# Patient Record
Sex: Female | Born: 2000 | Race: Black or African American | Hispanic: No | Marital: Single | Smoking: Never smoker
Health system: Southern US, Community
[De-identification: ages and names within clinical notes are randomized; demographics above are authoritative.]

## PROBLEM LIST (undated history)

## (undated) ENCOUNTER — Ambulatory Visit: Admission: EM

## (undated) DIAGNOSIS — N921 Excessive and frequent menstruation with irregular cycle: Secondary | ICD-10-CM

## (undated) DIAGNOSIS — K5090 Crohn's disease, unspecified, without complications: Secondary | ICD-10-CM

## (undated) DIAGNOSIS — E559 Vitamin D deficiency, unspecified: Secondary | ICD-10-CM

## (undated) DIAGNOSIS — R802 Orthostatic proteinuria, unspecified: Secondary | ICD-10-CM

## (undated) HISTORY — DX: Vitamin D deficiency, unspecified: E55.9

## (undated) HISTORY — DX: Excessive and frequent menstruation with irregular cycle: N92.1

## (undated) HISTORY — DX: Orthostatic proteinuria, unspecified: R80.2

## (undated) HISTORY — PX: NO PAST SURGERIES: SHX2092

---
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
## 2018-01-10 ENCOUNTER — Other Ambulatory Visit: Payer: Self-pay

## 2018-01-10 ENCOUNTER — Emergency Department: Payer: 59

## 2018-01-10 ENCOUNTER — Encounter: Payer: Self-pay | Admitting: Emergency Medicine

## 2018-01-10 ENCOUNTER — Emergency Department
Admission: EM | Admit: 2018-01-10 | Discharge: 2018-01-10 | Disposition: A | Discharge status: Home / Self Care | Payer: 59 | Attending: Emergency Medicine | Admitting: Emergency Medicine

## 2018-01-10 DIAGNOSIS — N938 Other specified abnormal uterine and vaginal bleeding: Secondary | ICD-10-CM | POA: Insufficient documentation

## 2018-01-10 DIAGNOSIS — N939 Abnormal uterine and vaginal bleeding, unspecified: Secondary | ICD-10-CM | POA: Diagnosis not present

## 2018-01-10 DIAGNOSIS — R102 Pelvic and perineal pain: Secondary | ICD-10-CM

## 2018-01-10 DIAGNOSIS — R140 Abdominal distension (gaseous): Secondary | ICD-10-CM | POA: Diagnosis not present

## 2018-01-10 LAB — URINALYSIS, COMPLETE (UACMP) WITH MICROSCOPIC
BILIRUBIN URINE: NEGATIVE
Glucose, UA: NEGATIVE mg/dL
KETONES UR: 20 mg/dL — AB
LEUKOCYTES UA: NEGATIVE
NITRITE: NEGATIVE
PROTEIN: 100 mg/dL — AB
Specific Gravity, Urine: 1.035 — ABNORMAL HIGH (ref 1.005–1.030)
pH: 5 (ref 5.0–8.0)

## 2018-01-10 LAB — CBC WITH DIFFERENTIAL/PLATELET
BASOS ABS: 0 10*3/uL (ref 0–0.1)
BASOS PCT: 1 %
EOS ABS: 0.1 10*3/uL (ref 0–0.7)
Eosinophils Relative: 2 %
HEMATOCRIT: 37.8 % (ref 35.0–47.0)
HEMOGLOBIN: 13.4 g/dL (ref 12.0–16.0)
Lymphocytes Relative: 26 %
Lymphs Abs: 1.7 10*3/uL (ref 1.0–3.6)
MCH: 29.2 pg (ref 26.0–34.0)
MCHC: 35.5 g/dL (ref 32.0–36.0)
MCV: 82.4 fL (ref 80.0–100.0)
MONOS PCT: 11 %
Monocytes Absolute: 0.7 10*3/uL (ref 0.2–0.9)
NEUTROS PCT: 60 %
Neutro Abs: 4 10*3/uL (ref 1.4–6.5)
PLATELETS: 271 10*3/uL (ref 150–440)
RBC: 4.59 MIL/uL (ref 3.80–5.20)
RDW: 13.1 % (ref 11.5–14.5)
WBC: 6.5 10*3/uL (ref 3.6–11.0)

## 2018-01-10 LAB — CHLAMYDIA/NGC RT PCR (ARMC ONLY)
Chlamydia Tr: NOT DETECTED
N gonorrhoeae: NOT DETECTED

## 2018-01-10 LAB — BASIC METABOLIC PANEL
Anion gap: 9 (ref 5–15)
BUN: 25 mg/dL — ABNORMAL HIGH (ref 6–20)
CALCIUM: 9.1 mg/dL (ref 8.9–10.3)
CO2: 19 mmol/L — ABNORMAL LOW (ref 22–32)
Chloride: 107 mmol/L (ref 101–111)
Creatinine, Ser: 0.58 mg/dL (ref 0.44–1.00)
Glucose, Bld: 83 mg/dL (ref 65–99)
POTASSIUM: 3.6 mmol/L (ref 3.5–5.1)
Sodium: 135 mmol/L (ref 135–145)

## 2018-01-10 LAB — HCG, QUANTITATIVE, PREGNANCY: hCG, Beta Chain, Quant, S: <1 m[IU]/mL (ref <5)

## 2018-01-10 LAB — TYPE AND SCREEN
ABO/RH(D): O POS
ANTIBODY SCREEN: NEGATIVE

## 2018-01-10 LAB — WET PREP, GENITAL
CLUE CELLS WET PREP: NONE SEEN
Sperm: NONE SEEN
TRICH WET PREP: NONE SEEN
Yeast Wet Prep HPF POC: NONE SEEN

## 2018-01-10 MED ORDER — TRANEXAMIC ACID 650 MG PO TABS: 1300.0000 mg | ORAL_TABLET | Freq: Three times a day (TID) | ORAL | 0 refills | Status: DC

## 2018-01-10 NOTE — ED Triage Notes (Signed)
Pt arrives ambulatory to triage with c/o heavy vaginal bleeding "for like 20 days". Pt reports that she has been changing her tampon every 45 min for the last two days. Pt is in NAD at this time in triage.

## 2018-01-10 NOTE — ED Notes (Signed)
Pt states unable to void at this time for POC.

## 2018-01-10 NOTE — ED Provider Notes (Addendum)
[facility] Emergency Department Provider Note  ____________________________________________   First MD Initiated Contact with Patient 01/10/18 2056     (approximate)  I have reviewed the triage vital signs and the nursing notes.   HISTORY  Chief Complaint Vaginal Bleeding   HPI Amber Gibson is a 17 y.o. female with a history of menometrorrhagia on Depakote, last shot 3 months ago who is presenting to the emergency department with leading over the past 20 days.  Says the bleeding has been recently more heavy and she feels that she is soaking 4-5 tampons per day at this time.  Said that she felt slightly lightheaded today which is why she came to the emergency department for further evaluation.  Saying that she also has noticed abdominal distention over the past several months which is made her concerned.  Past Medical History:  Diagnosis Date  . Menometrorrhagia   . Orthostatic proteinuria   . Vitamin D deficiency     Patient Active Problem List   Diagnosis Date Noted  . Menometrorrhagia 07/14/2015    Past Surgical History:  Procedure Laterality Date  . NO PAST SURGERIES      Prior to Admission medications   Medication Sig Start Date End Date Taking? Authorizing Provider  medroxyPROGESTERone (DEPO-PROVERA) 150 MG/ML injection Inject 1 mL (150 mg total) into the muscle every 3 (three) months. 05/09/17   Shambley, Melody N, CNM  naproxen (NAPROSYN) 500 MG tablet Take 1 tablet (500 mg total) 2 (two) times daily with a meal by mouth. As needed for pain Patient not taking: Reported on 10/11/2017 07/08/17   Shambley, Melody N, CNM  ondansetron (ZOFRAN ODT) 4 MG disintegrating tablet Take 1 tablet (4 mg total) by mouth every 8 (eight) hours as needed for nausea or vomiting. Patient not taking: Reported on 05/09/2017 03/30/17   Rebecka Apley, MD    Allergies Patient has no known allergies.  Family History  Problem Relation Age of Onset  .  Lupus Maternal Grandmother   . Diabetes Maternal Grandmother   . Heart disease Maternal Grandmother   . Hypertension Maternal Grandmother   . COPD Maternal Grandmother   . Diabetes Maternal Grandfather   . Heart disease Maternal Grandfather   . Hypertension Maternal Grandfather   . Cancer Neg Hx   . Stroke Neg Hx     Social History Social History   Tobacco Use  . Smoking status: Never Smoker  . Smokeless tobacco: Never Used  Substance Use Topics  . Alcohol use: No    Alcohol/week: 0.0 oz  . Drug use: No    Review of Systems  Constitutional: No fever/chills Eyes: No visual changes. ENT: No sore throat. Cardiovascular: Denies chest pain. Respiratory: Denies shortness of breath. Gastrointestinal: No nausea, no vomiting.  No diarrhea.  No constipation. Genitourinary: As above Musculoskeletal: Negative for back pain. Skin: Negative for rash. Neurological: Negative for headaches, focal weakness or numbness.   ____________________________________________   PHYSICAL EXAM:  VITAL SIGNS: ED Triage Vitals  Enc Vitals Group     BP 01/10/18 1921 124/79     Pulse Rate 01/10/18 1921 85     Resp 01/10/18 1921 18     Temp 01/10/18 1921 98.2 F (36.8 C)     Temp Source 01/10/18 1921 Oral     SpO2 01/10/18 1921 96 %     Weight 01/10/18 1918 137 lb (62.1 kg)     Height 01/10/18 1918 5\' 3"  (1.6 m)  Head Circumference --      Peak Flow --      Pain Score 01/10/18 1918 6     Pain Loc --      Pain Edu? --      Excl. in GC? --     Constitutional: Alert and oriented. Well appearing and in no acute distress. Eyes: Conjunctivae are normal.  Head: Atraumatic. Nose: No congestion/rhinnorhea. Mouth/Throat: Mucous membranes are moist.  Neck: No stridor.   Cardiovascular: Normal rate, regular rhythm. Grossly normal heart sounds.  Good peripheral circulation. Respiratory: Normal respiratory effort.  No retractions. Lungs CTAB. Gastrointestinal: Soft and nontender. No  distention. No CVA tenderness. Genitourinary: Normal external exam without any lesions.  Speculum exam with a small amount of blood surrounding the cervix.  However, there is no active bleeding from the cervical loss.  No discharge.  Bimanual exam without CMT.  No uterine nor adnexal tenderness nor masses. Musculoskeletal: No lower extremity tenderness nor edema.  No joint effusions. Neurologic:  Normal speech and language. No gross focal neurologic deficits are appreciated. Skin:  Skin is warm, dry and intact. No rash noted. Psychiatric: Mood and affect are normal. Speech and behavior are normal.  ____________________________________________   LABS (all labs ordered are listed, but only abnormal results are displayed)  Labs Reviewed  WET PREP, GENITAL - Abnormal; Notable for the following components:      Result Value   WBC, Wet Prep HPF POC FEW (*)    All other components within normal limits  BASIC METABOLIC PANEL - Abnormal; Notable for the following components:   CO2 19 (*)    BUN 25 (*)    All other components within normal limits  CHLAMYDIA/NGC RT PCR ([facility] ONLY)  HCG, QUANTITATIVE, PREGNANCY  CBC WITH DIFFERENTIAL/PLATELET  URINALYSIS, COMPLETE (UACMP) WITH MICROSCOPIC  POC URINE PREG, ED  TYPE AND SCREEN   ____________________________________________  EKG   ____________________________________________  RADIOLOGY  Normal ultrasound of the pelvis without any masses. ____________________________________________   PROCEDURES  Procedure(s) performed:   Procedures  Critical Care performed:   ____________________________________________   INITIAL IMPRESSION / ASSESSMENT AND PLAN / ED COURSE  Pertinent labs & imaging results that were available during my care of the patient were reviewed by me and considered in my medical decision making (see chart for details).  Differential diagnosis includes, but is not limited to, ovarian cyst, ovarian torsion,  acute appendicitis, diverticulitis, urinary tract infection/pyelonephritis, endometriosis, bowel obstruction, colitis, renal colic, gastroenteritis, hernia, fibroids, endometriosis, pregnancy related pain including ectopic pregnancy, etc. As part of my medical decision making, I reviewed the following data within the electronic MEDICAL RECORD NUMBER Notes from prior ED visits  ----------------------------------------- 10:43 PM on 01/10/2018 -----------------------------------------  I discussed the case with Dr. Valentino Saxon of [facility] who recommends TXA over the next 5 days, 1300, 3 times daily.  Patient has failed multiple birth control pills secondary to noncompliance as well as nausea and vomiting.  Discussions have been made with the patient regarding an IUD.  She will need to follow-up with her OB/GYN for a more long-term solution to the bleeding.  However, the TXA should hopefully relieve the symptoms over the next few days.  Patient aware of lab as well as imaging results.  Return precautions given especially for worsening bleeding, pain or lightheadedness or syncope.  Patient understanding the diagnosis as well as to the treatment plan willing to comply. ____________________________________________   FINAL CLINICAL IMPRESSION(S) / ED DIAGNOSES  Final diagnoses:  Pelvic pain  Pelvic pain  Dysfunctional uterine bleeding.    NEW MEDICATIONS STARTED DURING THIS VISIT:  New Prescriptions   No medications on file     Note:  This document was prepared using Dragon voice recognition software and may include unintentional dictation errors.     Myrna Blazer, MD 01/10/18 2244    Myrna Blazer, MD 01/10/18 [phone]

## 2018-01-13 ENCOUNTER — Ambulatory Visit: Payer: 59

## 2018-02-04 ENCOUNTER — Encounter: Payer: Self-pay | Admitting: Family Medicine

## 2018-02-04 ENCOUNTER — Ambulatory Visit (INDEPENDENT_AMBULATORY_CARE_PROVIDER_SITE_OTHER): Payer: 59 | Admitting: Family Medicine

## 2018-02-04 VITALS — BP 122/68 | HR 95 | Temp 98.6°F | Resp 12 | Ht 63.5 in | Wt 137.5 lb

## 2018-02-04 DIAGNOSIS — R8290 Unspecified abnormal findings in urine: Secondary | ICD-10-CM | POA: Diagnosis not present

## 2018-02-04 DIAGNOSIS — N921 Excessive and frequent menstruation with irregular cycle: Secondary | ICD-10-CM | POA: Diagnosis not present

## 2018-02-04 DIAGNOSIS — E559 Vitamin D deficiency, unspecified: Secondary | ICD-10-CM

## 2018-02-04 NOTE — Patient Instructions (Addendum)
Let's start with labs We'll have you see the new provider at [facility] If you have not heard anything from my staff in a week about any orders/referrals/studies from today, please contact us here to follow-up [phone]   Safe Sex Practicing safe sex means taking steps before and during sex to reduce your risk of:  Getting an STD (sexually transmitted disease).  Giving your partner an STD.  Unwanted pregnancy.  How can I practice safe sex?  To practice safe sex:  Limit your sexual partners to only one partner who is having sex with only you.  Avoid using alcohol and recreational drugs before having sex. These substances can affect your judgment.  Before having sex with a new partner: ? Talk to your partner about past partners, past STDs, and drug use. ? You and your partner should be screened for STDs and discuss the results with each other.  Check your body regularly for sores, blisters, rashes, or unusual discharge. If you notice any of these problems, visit your health care provider.  If you have symptoms of an infection or you are being treated for an STD, avoid sexual contact.  While having sex, use a condom. Make sure to: ? Use a condom every time you have vaginal, oral, or anal sex. Both females and males should wear condoms during oral sex. ? Keep condoms in place from the beginning to the end of sexual activity. ? Use a latex condom, if possible. Latex condoms offer the best protection. ? Use only water-based lubricants or oils to lubricate a condom. Using petroleum-based lubricants or oils will weaken the condom and increase the chance that it will break.  See your health care provider for regular screenings, exams, and tests for STDs.  Talk with your health care provider about the form of birth control (contraception) that is best for you.  Get vaccinated against hepatitis B and human papillomavirus (HPV).  If you are at risk of being infected with HIV  (human immunodeficiency virus), talk with your health care provider about taking a prescription medicine to prevent HIV infection. You are considered at risk for HIV if: ? You are a man who has sex with other men. ? You are a heterosexual man or woman who is sexually active with more than one partner. ? You take drugs by injection. ? You are sexually active with a partner who has HIV.  This information is not intended to replace advice given to you by your health care provider. Make sure you discuss any questions you have with your health care provider. Document Released: 09/20/2004 Document Revised: 12/28/2015 Document Reviewed: 07/03/2015 Elsevier Interactive Patient Education  [employer].

## 2018-02-04 NOTE — Progress Notes (Signed)
BP 122/68   Pulse 95   Temp 98.6 F (37 C) (Oral)   Resp 12   Ht 5' 3.5" (1.613 m)   Wt 137 lb 8 oz (62.4 kg)   LMP 12/02/2017   SpO2 97%   BMI 23.97 kg/m    Subjective:    Patient ID: Amber Gibson, female    DOB: 05-26-2001, 17 y.o.   MRN: [id number]  HPI: Amber Gibson is a 17 y.o. female  Chief Complaint  Patient presents with  . New Patient (Initial Visit)  . Menstrual Problem    started on April 8 and has not stopped bleeding since    HPI Patient is here to re-establish care at this clinic; she was seen by me at [facility] on 07/14/2015 for a well adolescent visit  She has had ongoing menstrual issues, with numerous visits in the medical record with gynecologist; most recently, she started bleeding on April 8th and says that she has not stopped bleeding since then; she has a gynecologist; was in the ER on 01/10/18; note reviewed  Flow is heavier at work; on her feet; has cramping; lighter when home; bleeding every day since April 8th; using naproxen for cramps when needed; does not need refills  Not getting iron-rich foods; energy level is okay some days  Labs reviewed; abnormal urine in ER in May; GC and chl negative; no trich, no clue cells; not anemic (normal CBC); quant hCG <1  Saw gyn and the ER doctor also consulted with GYN during her recent ER visit; patient was supposed to f/u with GYN for this issue; patient says the lysteda did not help; the next option she was given was for an IUD but not interested she'd like to go to another GYN practice  I don't see a thyroid checked; maternal aunt has thyroid trouble; patient has noticed weight gain, fifteen pounds over the last 6 months; having constipation; hair has been shedding and falling out faster  Typical [place] diet  She picks up heavy stuff at work; has a work restriction from the ER; picks up over 30 pounds before the work restriction; they wrote her for no more than 20  pounds; that has not been working well for her she says; I searched the ER record but do not see anything about a work restriction, no letter in the chart; she said her boss hasn't been following it anyway  Normal pelvic US on 01/10/18; noted in chart, reviewed with patient  Sexually active; no condoms, just withdrawal  Depression screen [facility] 2/9 02/04/2018  Decreased Interest 0  Down, Depressed, Hopeless 0  PHQ - 2 Score 0    Relevant past medical, surgical, family and social history reviewed Past Medical History:  Diagnosis Date  . Menometrorrhagia   . Orthostatic proteinuria   . Vitamin D deficiency    Past Surgical History:  Procedure Laterality Date  . NO PAST SURGERIES     Family History  Problem Relation Age of Onset  . Lupus Maternal Grandmother   . Diabetes Maternal Grandmother   . Heart disease Maternal Grandmother   . Hypertension Maternal Grandmother   . COPD Maternal Grandmother   . Diabetes Maternal Grandfather   . Heart disease Maternal Grandfather   . Hypertension Maternal Grandfather   . Cancer Neg Hx   . Stroke Neg Hx    Social History   Tobacco Use  . Smoking status: Never Smoker  . Smokeless tobacco: Never Used  Substance  Use Topics  . Alcohol use: No    Alcohol/week: 0.0 oz  . Drug use: No    Interim medical history since last visit reviewed. Allergies and medications reviewed  Review of Systems Per HPI unless specifically indicated above     Objective:    BP 122/68   Pulse 95   Temp 98.6 F (37 C) (Oral)   Resp 12   Ht 5' 3.5" (1.613 m)   Wt 137 lb 8 oz (62.4 kg)   LMP 12/02/2017   SpO2 97%   BMI 23.97 kg/m   Wt Readings from Last 3 Encounters:  02/04/18 137 lb 8 oz (62.4 kg) (69 %, Z= 0.49)*  01/10/18 137 lb (62.1 kg) (68 %, Z= 0.48)*  10/11/17 132 lb 11.2 oz (60.2 kg) (63 %, Z= 0.33)*   * Growth percentiles are based on CDC (Girls, 2-20 Years) data.    Physical Exam  Constitutional: She appears well-developed and  well-nourished.  HENT:  Mouth/Throat: Mucous membranes are normal.  Eyes: EOM are normal. No scleral icterus.  Neck: No thyromegaly present.  Cardiovascular: Normal rate and regular rhythm.  Pulmonary/Chest: Effort normal and breath sounds normal.  Abdominal: Soft. Bowel sounds are normal. She exhibits distension (full, but soft). There is no tenderness. There is no guarding.  Musculoskeletal: She exhibits no edema.  Neurological:  Reflex Scores:      Patellar reflexes are 2+ on the right side and 2+ on the left side. Psychiatric: She has a normal mood and affect. Her behavior is normal.    Results for orders placed or performed during the hospital encounter of 01/10/18  Wet prep, genital  Result Value Ref Range   Yeast Wet Prep HPF POC NONE SEEN NONE SEEN   Trich, Wet Prep NONE SEEN NONE SEEN   Clue Cells Wet Prep HPF POC NONE SEEN NONE SEEN   WBC, Wet Prep HPF POC FEW (A) NONE SEEN   Sperm NONE SEEN   Chlamydia/NGC rt PCR ([facility] only)  Result Value Ref Range   Specimen source GC/Chlam CHLAMYDIA SPECIES    Chlamydia Tr NOT DETECTED NOT DETECTED   N gonorrhoeae NOT DETECTED NOT DETECTED  hCG, quantitative, pregnancy  Result Value Ref Range   hCG, Beta Chain, Quant, S <1 <5 mIU/mL  CBC with Differential  Result Value Ref Range   WBC 6.5 3.6 - 11.0 K/uL   RBC 4.59 3.80 - 5.20 MIL/uL   Hemoglobin 13.4 12.0 - 16.0 g/dL   HCT 26.7 12.4 - 58.0 %   MCV 82.4 80.0 - 100.0 fL   MCH 29.2 26.0 - 34.0 pg   MCHC 35.5 32.0 - 36.0 g/dL   RDW 99.8 33.8 - 25.0 %   Platelets 271 150 - 440 K/uL   Neutrophils Relative % 60 %   Neutro Abs 4.0 1.4 - 6.5 K/uL   Lymphocytes Relative 26 %   Lymphs Abs 1.7 1.0 - 3.6 K/uL   Monocytes Relative 11 %   Monocytes Absolute 0.7 0.2 - 0.9 K/uL   Eosinophils Relative 2 %   Eosinophils Absolute 0.1 0 - 0.7 K/uL   Basophils Relative 1 %   Basophils Absolute 0.0 0 - 0.1 K/uL  Basic metabolic panel  Result Value Ref Range   Sodium 135 135 - 145 mmol/L     Potassium 3.6 3.5 - 5.1 mmol/L   Chloride 107 101 - 111 mmol/L   CO2 19 (L) 22 - 32 mmol/L   Glucose, Bld 83 65 - 99  mg/dL   BUN 25 (H) 6 - 20 mg/dL   Creatinine, Ser 1.61 0.44 - 1.00 mg/dL   Calcium 9.1 8.9 - 09.6 mg/dL   GFR calc non Af Amer >60 >60 mL/min   GFR calc Af Amer >60 >60 mL/min   Anion gap 9 5 - 15  Urinalysis, Complete w Microscopic  Result Value Ref Range   Color, Urine YELLOW (A) YELLOW   APPearance HAZY (A) CLEAR   Specific Gravity, Urine 1.035 (H) 1.005 - 1.030   pH 5.0 5.0 - 8.0   Glucose, UA NEGATIVE NEGATIVE mg/dL   Hgb urine dipstick LARGE (A) NEGATIVE   Bilirubin Urine NEGATIVE NEGATIVE   Ketones, ur 20 (A) NEGATIVE mg/dL   Protein, ur 045 (A) NEGATIVE mg/dL   Nitrite NEGATIVE NEGATIVE   Leukocytes, UA NEGATIVE NEGATIVE   RBC / HPF >50 (H) 0 - 5 RBC/hpf   WBC, UA 0-5 0 - 5 WBC/hpf   Bacteria, UA RARE (A) NONE SEEN   Squamous Epithelial / LPF 0-5 0 - 5   Mucus PRESENT   Type and screen [facility]  Result Value Ref Range   ABO/RH(D) O POS    Antibody Screen NEG    Sample Expiration      01/13/2018 Performed at [facility], [address] [place], [place] [address]       Assessment & Plan:   Problem List Items Addressed This Visit      Other   Menometrorrhagia - Primary    Refer back to GYN      Relevant Orders   TSH + free T4   Pregnancy, urine   CBC with Differential/Platelet   COMPLETE METABOLIC PANEL WITH GFR   Ferritin   Ambulatory referral to Obstetrics / Gynecology    Other Visit Diagnoses    Abnormal urine findings       recheck urine today   Relevant Orders   Urinalysis w microscopic + reflex cultur   Vitamin D deficiency       Relevant Orders   VITAMIN D 25 Hydroxy (Vit-D Deficiency, Fractures)       Follow up plan: No follow-ups on file.  An after-visit summary was printed and given to the patient at check-out.  Please see the patient instructions which may contain other  information and recommendations beyond what is mentioned above in the assessment and plan.  No orders of the defined types were placed in this encounter.   Orders Placed This Encounter  Procedures  . TSH + free T4  . Pregnancy, urine  . CBC with Differential/Platelet  . COMPLETE METABOLIC PANEL WITH GFR  . VITAMIN D 25 Hydroxy (Vit-D Deficiency, Fractures)  . Ferritin  . Urinalysis w microscopic + reflex cultur  . Ambulatory referral to Obstetrics / Gynecology

## 2018-02-04 NOTE — Assessment & Plan Note (Signed)
Refer back to GYN.

## 2018-02-05 ENCOUNTER — Other Ambulatory Visit: Payer: Self-pay | Admitting: Family Medicine

## 2018-02-05 ENCOUNTER — Telehealth: Payer: Self-pay

## 2018-02-05 DIAGNOSIS — R319 Hematuria, unspecified: Secondary | ICD-10-CM

## 2018-02-05 LAB — CBC WITH DIFFERENTIAL/PLATELET
BASOS PCT: 0.7 %
Basophils Absolute: 39 cells/uL (ref 0–200)
EOS ABS: 160 {cells}/uL (ref 15–500)
Eosinophils Relative: 2.9 %
HEMATOCRIT: 39.3 % (ref 34.0–46.0)
Hemoglobin: 13.3 g/dL (ref 11.5–15.3)
LYMPHS ABS: 1436 {cells}/uL (ref 1200–5200)
MCH: 27.5 pg (ref 25.0–35.0)
MCHC: 33.8 g/dL (ref 31.0–36.0)
MCV: 81.2 fL (ref 78.0–98.0)
MPV: 9.9 fL (ref 7.5–12.5)
Monocytes Relative: 10.4 %
NEUTROS PCT: 59.9 %
Neutro Abs: 3295 cells/uL (ref 1800–8000)
Platelets: 281 10*3/uL (ref 140–400)
RBC: 4.84 10*6/uL (ref 3.80–5.10)
RDW: 12.6 % (ref 11.0–15.0)
TOTAL LYMPHOCYTE: 26.1 %
WBC: 5.5 10*3/uL (ref 4.5–13.0)
WBCMIX: 572 {cells}/uL (ref 200–900)

## 2018-02-05 LAB — COMPLETE METABOLIC PANEL WITH GFR
AG RATIO: 1.4 (calc) (ref 1.0–2.5)
ALT: 18 U/L (ref 5–32)
AST: 17 U/L (ref 12–32)
Albumin: 4.6 g/dL (ref 3.6–5.1)
Alkaline phosphatase (APISO): 55 U/L (ref 47–176)
BILIRUBIN TOTAL: 0.4 mg/dL (ref 0.2–1.1)
BUN / CREAT RATIO: 40 (calc) — AB (ref 6–22)
BUN: 24 mg/dL — ABNORMAL HIGH (ref 7–20)
CALCIUM: 9.8 mg/dL (ref 8.9–10.4)
CHLORIDE: 107 mmol/L (ref 98–110)
CO2: 23 mmol/L (ref 20–32)
Creat: 0.6 mg/dL (ref 0.50–1.00)
GFR, EST AFRICAN AMERICAN: 154 mL/min/{1.73_m2} (ref >=60)
GFR, EST NON AFRICAN AMERICAN: 133 mL/min/{1.73_m2} (ref >=60)
GLOBULIN: 3.2 g/dL (ref 2.0–3.8)
Glucose, Bld: 86 mg/dL (ref 65–99)
POTASSIUM: 4.2 mmol/L (ref 3.8–5.1)
SODIUM: 138 mmol/L (ref 135–146)
TOTAL PROTEIN: 7.8 g/dL (ref 6.3–8.2)

## 2018-02-05 LAB — URINALYSIS W MICROSCOPIC + REFLEX CULTURE
BILIRUBIN URINE: NEGATIVE
Glucose, UA: NEGATIVE
Hyaline Cast: NONE SEEN /LPF
KETONES UR: NEGATIVE
LEUKOCYTE ESTERASE: NEGATIVE
Nitrites, Initial: NEGATIVE
PROTEIN: NEGATIVE
Specific Gravity, Urine: 1.021 (ref 1.001–1.03)
pH: 5.5 (ref 5.0–8.0)

## 2018-02-05 LAB — TSH+FREE T4: TSH W/REFLEX TO FT4: 1.61 mIU/L

## 2018-02-05 LAB — NO CULTURE INDICATED

## 2018-02-05 LAB — FERRITIN: Ferritin: 64 ng/mL (ref 6–67)

## 2018-02-05 LAB — VITAMIN D 25 HYDROXY (VIT D DEFICIENCY, FRACTURES): VIT D 25 HYDROXY: 15 ng/mL — AB (ref 30–100)

## 2018-02-05 LAB — PREGNANCY, URINE: Preg Test, Ur: NEGATIVE

## 2018-02-05 MED ORDER — VITAMIN D (ERGOCALCIFEROL) 1.25 MG (50000 UNIT) PO CAPS: 50000.0000 [IU] | ORAL_CAPSULE | ORAL | 1 refills | Status: AC

## 2018-02-05 NOTE — Progress Notes (Signed)
Start Rx vitamin D

## 2018-02-05 NOTE — Telephone Encounter (Signed)
-----   Message from Kerman Passey, MD sent at 02/05/2018  1:21 PM EDT ----- Asher Muir, please let pt know that her urine pregnancy test was negative; her vitamin D is really low, so start Rx vitamin D weekly for 8 weeks, then take 1,000 iu daily OTC Her total body iron stores are fine Her urine was normal except for blood in it which is likely from the menstrual bleeding; however, there has been blood persistently in her urine; I recommend a cath specimen (see if hospital lab can do or if we need to refer to urologist to get a cath urine, evaluate the blood in urine)

## 2018-02-11 ENCOUNTER — Ambulatory Visit (INDEPENDENT_AMBULATORY_CARE_PROVIDER_SITE_OTHER): Payer: 59 | Admitting: Obstetrics and Gynecology

## 2018-02-11 ENCOUNTER — Encounter: Payer: Self-pay | Admitting: Obstetrics and Gynecology

## 2018-02-11 VITALS — BP 120/80 | Ht 63.0 in | Wt 139.0 lb

## 2018-02-11 DIAGNOSIS — N938 Other specified abnormal uterine and vaginal bleeding: Secondary | ICD-10-CM | POA: Diagnosis not present

## 2018-02-11 DIAGNOSIS — N946 Dysmenorrhea, unspecified: Secondary | ICD-10-CM

## 2018-02-11 DIAGNOSIS — N921 Excessive and frequent menstruation with irregular cycle: Secondary | ICD-10-CM | POA: Diagnosis not present

## 2018-02-11 NOTE — Progress Notes (Signed)
Amber Passey, MD   Chief Complaint  Patient presents with  . Menstrual Problem    HPI:      Ms. Amber Gibson is a 17 y.o. [id number] who LMP was Patient's last menstrual period was 12/02/2017., presents today for NP eval of DUB and dysmen, referred by PCP (Lada, Janit Bern, MD. Pt with a long menstrual hx of menometrorrhagia. Menarche age 43. Menses were Q3 wks, lasting 10 days with dysmen, not relieved with NSAIDs. At some point, pt started OCPs for cycle control. Pt tried 3 different kinds but had daily vomiting with pills and irreg bleeding. Pt missed/was late with some pills. She was then started on depo about 2 yrs ago. Pt had irreg bleeding and tried more frequent dosing of Q10 wks and then Q8 wks without bleeding relief. Pt also tried OCPs and POPs with depo without bleeding relief. Pt's last depo 1/19. Started bleeding again 4/19 and bled daily until 02/08/18. Bleeding with dime to quarter sized clots, changing pads/tampons Q2 hrs. Pt with dysmen, not relieved with anaprox. Has had to miss work due to [employer]. Pt went to ED 5/19 and was given Lysteda. Sx improved while taking it but then resumed again. Pt offered IUD but not interested at the time.  Pt had neg TSH, beta HcG and CBC with PCP 6/19. Had neg GYN u/s 5/19, EM=3.2 mm and neg gon/chlam 5/19. Pt is sex active, not using condoms.    Past Medical History:  Diagnosis Date  . Menometrorrhagia   . Orthostatic proteinuria   . Vitamin D deficiency     Past Surgical History:  Procedure Laterality Date  . NO PAST SURGERIES      Family History  Problem Relation Age of Onset  . Lupus Maternal Grandmother   . Diabetes Maternal Grandmother   . Heart disease Maternal Grandmother   . Hypertension Maternal Grandmother   . COPD Maternal Grandmother   . Diabetes Maternal Grandfather   . Heart disease Maternal Grandfather   . Hypertension Maternal Grandfather   . Cancer Neg Hx   . Stroke Neg Hx     Social History    Socioeconomic History  . Marital status: Single    Spouse name: Not on file  . Number of children: Not on file  . Years of education: Not on file  . Highest education level: Not on file  Occupational History  . Not on file  Social Needs  . Financial resource strain: Not on file  . Food insecurity:    Worry: Not on file    Inability: Not on file  . Transportation needs:    Medical: Not on file    Non-medical: Not on file  Tobacco Use  . Smoking status: Never Smoker  . Smokeless tobacco: Never Used  Substance and Sexual Activity  . Alcohol use: No    Alcohol/week: 0.0 oz  . Drug use: No  . Sexual activity: Yes    Birth control/protection: None  Lifestyle  . Physical activity:    Days per week: Not on file    Minutes per session: Not on file  . Stress: Not on file  Relationships  . Social connections:    Talks on phone: Not on file    Gets together: Not on file    Attends religious service: Not on file    Active member of club or organization: Not on file    Attends meetings of clubs or organizations: Not on file  Relationship status: Not on file  . Intimate partner violence:    Fear of current or ex partner: Not on file    Emotionally abused: Not on file    Physically abused: Not on file    Forced sexual activity: Not on file  Other Topics Concern  . Not on file  Social History Narrative  . Not on file    Outpatient Medications Prior to Visit  Medication Sig Dispense Refill  . naproxen (NAPROSYN) 500 MG tablet Take 1 tablet (500 mg total) 2 (two) times daily with a meal by mouth. As needed for pain 180 tablet 2  . Vitamin D, Ergocalciferol, (DRISDOL) 50000 units CAPS capsule Take 1 capsule (50,000 Units total) by mouth every 7 (seven) days. (Patient not taking: Reported on 02/11/2018) 4 capsule 1   No facility-administered medications prior to visit.       ROS:  Review of Systems  Constitutional: Positive for fatigue. Negative for fever and unexpected  weight change.  Respiratory: Negative for cough, shortness of breath and wheezing.   Cardiovascular: Negative for chest pain, palpitations and leg swelling.  Gastrointestinal: Negative for blood in stool, constipation, diarrhea, nausea and vomiting.  Endocrine: Negative for cold intolerance, heat intolerance and polyuria.  Genitourinary: Positive for menstrual problem. Negative for dyspareunia, dysuria, flank pain, frequency, genital sores, hematuria, pelvic pain, urgency, vaginal bleeding, vaginal discharge and vaginal pain.  Musculoskeletal: Negative for back pain, joint swelling and myalgias.  Skin: Negative for rash.  Neurological: Negative for dizziness, syncope, light-headedness, numbness and headaches.  Hematological: Negative for adenopathy.  Psychiatric/Behavioral: Negative for agitation, confusion, sleep disturbance and suicidal ideas. The patient is not nervous/anxious.    BREAST: No symptoms   OBJECTIVE:   Vitals:  BP 120/80   Ht 5\' 3"  (1.6 m)   Wt 139 lb (63 kg)   LMP 12/02/2017   BMI 24.62 kg/m   Physical Exam  Constitutional: She is oriented to person, place, and time. Vital signs are normal. She appears well-developed.  Neck: Normal range of motion.  Pulmonary/Chest: Effort normal.  Genitourinary: Vagina normal and uterus normal. There is no rash, tenderness or lesion on the right labia. There is no rash, tenderness or lesion on the left labia. Uterus is not enlarged and not tender. Cervix exhibits no motion tenderness. Right adnexum displays no mass and no tenderness. Left adnexum displays no mass and no tenderness. No erythema or tenderness in the vagina. No vaginal discharge found.  Musculoskeletal: Normal range of motion.  Neurological: She is alert and oriented to person, place, and time.  Psychiatric: She has a normal mood and affect. Her behavior is normal. Thought content normal.  Vitals reviewed.   Assessment/Plan: DUB (dysfunctional uterine bleeding) -  On several forms of BC and then after stopping depo. Bleeding has since stopped. Neg eval with PCP/ED. Neg exam today.  Menometrorrhagia - Not controlled with OCPs/depo in past.   Dysmenorrhea  Discussed normal cycles and question if all the hormonal changes of frequent depo shots, missed OCPs, etc, have contributed to sx. Discussed going off hormones for a few months to see if cycles regulate/improve vs IUD and nexplanon. Pt with vomiting with OCPs but may do fine with nuvaring. Pt elects to follow cycles. If still problematic, pt is interested in [place] IUD. CONDOMS! Handout/pro/cons discussed. Cont naproxen BID for dysmen, and can take 2-3 days before menses to help with sx.     Return if symptoms worsen or fail to improve.  Avyay Coger B. Eliceo Gladu,  PA-C 02/11/2018 12:22 PM

## 2018-02-11 NOTE — Patient Instructions (Signed)
I value your feedback and entrusting us with your care. If you get a [facility] [place] patient survey, I would appreciate you taking the time to let us know about your experience today. Thank you! 

## 2018-02-26 ENCOUNTER — Other Ambulatory Visit: Payer: Self-pay | Admitting: Family Medicine

## 2018-02-26 NOTE — Telephone Encounter (Signed)
I prescribed a two month supply on 02/05/2018 She should not need any beyond what I prescribed on June 12th After she finishes the prescription, she should take over-the-counter vitamin D  See note from labs 02/05/2018: "her vitamin D is really low, so start Rx vitamin D weekly for 8 weeks, then take 1,000 iu daily OTC"  Thank you

## 2018-03-11 NOTE — Progress Notes (Deleted)
03/12/2018 7:06 PM   Amber Gibson 2001/07/02 [id number]  Referring provider: Kerman Passey, MD [address] [place], [place] [address]  No chief complaint on file.   HPI: Patient is a 17 -year-old [place] American female who presents today as a referral from Dr. Baruch Gouty for possible hematuria.    Patient was found to have *** hematuria on *** with *** RBC's/hpf.  Patient *** does/doesn't have a prior history of microscopic hematuria.    She/He does/ does not have a prior history of recurrent urinary tract infections, nephrolithiasis, trauma to the genitourinary tract or malignancies of the genitourinary tract. ***  She/He does/does not have a family medical history of nephrolithiasis, malignancies of the genitourinary tract or hematuria. ***  Today, she/he are having/not having symptoms of frequent urination, urgency, dysuria, nocturia, incontinence, hesitancy, intermittency, straining to urinate or a weak urinary stream.  Patient denies any gross hematuria, dysuria or suprapubic/flank pain.  Patient denies any fevers, chills, nausea or vomiting. Her/His UA today demonstrates ***.  Patient denies any gross hematuria, dysuria or suprapubic/flank pain.  Patient denies any fevers, chills, nausea or vomiting.  ***  She/He have/have not had any recent imaging studies. ***  He/She are/are not a smoker. She/He is a former smoker, with a*** ppd history.  Quit *** years ago.  They are/are not exposed to secondhand smoke.  They have/have not worked with Personnel officer, trichloroethylene, etc.   ***  He/She has HTN. ***   He/She has a high BMI.     PMH: Past Medical History:  Diagnosis Date  . Menometrorrhagia   . Orthostatic proteinuria   . Vitamin D deficiency     Surgical History: Past Surgical History:  Procedure Laterality Date  . NO PAST SURGERIES      Home Medications:  Allergies as of 03/12/2018   No Known Allergies     Medication  List        Accurate as of 03/11/18  7:06 PM. Always use your most recent med list.          naproxen 500 MG tablet Commonly known as:  NAPROSYN Take 1 tablet (500 mg total) 2 (two) times daily with a meal by mouth. As needed for pain   Vitamin D (Ergocalciferol) 50000 units Caps capsule Commonly known as:  DRISDOL Take 1 capsule (50,000 Units total) by mouth every 7 (seven) days.       Allergies: No Known Allergies  Family History: Family History  Problem Relation Age of Onset  . Lupus Maternal Grandmother   . Diabetes Maternal Grandmother   . Heart disease Maternal Grandmother   . Hypertension Maternal Grandmother   . COPD Maternal Grandmother   . Diabetes Maternal Grandfather   . Heart disease Maternal Grandfather   . Hypertension Maternal Grandfather   . Cancer Neg Hx   . Stroke Neg Hx     Social History:  reports that she has never smoked. She has never used smokeless tobacco. She reports that she does not drink alcohol or use drugs.  ROS:                                        Physical Exam: There were no vitals taken for this visit.  Constitutional:  Well nourished. Alert and oriented, No acute distress. HEENT: [place] AT, moist mucus membranes.  Trachea midline, no masses. Cardiovascular:  No clubbing, cyanosis, or edema. Respiratory: Normal respiratory effort, no increased work of breathing. GI: Abdomen is soft, non tender, non distended, no abdominal masses. Liver and spleen not palpable.  No hernias appreciated.  Stool sample for occult testing is not indicated.   GU: No CVA tenderness.  No bladder fullness or masses.   Skin: No rashes, bruises or suspicious lesions. Lymph: No cervical or inguinal adenopathy. Neurologic: Grossly intact, no focal deficits, moving all 4 extremities. Psychiatric: Normal mood and affect.  Laboratory Data: Lab Results  Component Value Date   WBC 5.5 02/04/2018   HGB 13.3 02/04/2018   HCT 39.3  02/04/2018   MCV 81.2 02/04/2018   PLT 281 02/04/2018    Lab Results  Component Value Date   CREATININE 0.60 02/04/2018    No results found for: PSA  No results found for: TESTOSTERONE  No results found for: HGBA1C  Lab Results  Component Value Date   TSH 0.925 07/14/2015       Component Value Date/Time   CHOL 121 07/14/2015 1523   HDL 54 07/14/2015 1523   LDLCALC 60 07/14/2015 1523    Lab Results  Component Value Date   AST 17 02/04/2018   Lab Results  Component Value Date   ALT 18 02/04/2018   No components found for: ALKALINEPHOPHATASE No components found for: BILIRUBINTOTAL  No results found for: ESTRADIOL   Urinalysis    Component Value Date/Time   COLORURINE YELLOW 02/04/2018 1044   APPEARANCEUR CLEAR 02/04/2018 1044   LABSPEC 1.021 02/04/2018 1044   PHURINE 5.5 02/04/2018 1044   GLUCOSEU NEGATIVE 02/04/2018 1044   HGBUR 3+ (A) 02/04/2018 1044   BILIRUBINUR NEGATIVE 01/10/2018 2115   KETONESUR NEGATIVE 02/04/2018 1044   PROTEINUR NEGATIVE 02/04/2018 1044   NITRITE NEGATIVE 01/10/2018 2115   LEUKOCYTESUR NEGATIVE 01/10/2018 2115    Pertinent Imaging: ***  Assessment & Plan:  ***  1. *** hematuria Explained to the patient that there are a number of causes that can be associated with blood in the urine, such as stones, *** BPH, UTI's, damage to the urinary tract and/or cancer. At this time, I felt that the patient warranted further urologic evaluation.   The AUA guidelines state that a CT urogram is the preferred imaging study to evaluate hematuria. I explained to the patient that a contrast material will be injected into a vein and that in rare instances, an allergic reaction can result and may even life threatening   The patient denies any allergies to contrast***, iodine and/or seafood*** and is not taking metformin.*** Her reproductive status is hysterectomy, postmenopausal, tubal ligation are unknown at this time.  We will obtain a serum  pregnancy test today. ***  Explained to the patient that the imaging studies performed on ***are not the recommended studies by the AUA for the work-up of blood in the urine.    The imaging studies that were performed lack the detail of excluding some urological tumors. The recommended study is a CT urogram.  This study does require the use of contrast material.    At this time, he/she may choose to forego the appropriate study with the understanding that they are risking a missed diagnosis of a urological cancer and proceed with in office cystoscopy Explained to the patient that almost half of all renal cell carcinoma is found incidentally and not to wait on the classic triad of flank pain, palpable abdominal mass and gross hematuria that is generally only seen in more advanced disease.  There are currently 64,000 new cases in the [place] and 14,000 deaths in the [place] every year.   They may also choose to undergo the appropriate study (CTU) or choose to undergo a cystoscopy with bilateral RTG's to complete the hematuria work up. ***  On occasion, we may need to resort to a non-contrast study such as a renal ultrasound or a non-contrast CT if the patient has a contrast allergy or renal insufficiency.  In the latter case,  I told him/her *** that an upper tract study without contrast will lack the detail of excluding some urologic tumors.  Because of this, he/she would need to undergo cystoscopy with bilateral retrogrades in the OR to complete the hematuria workup in addition to the imaging studies. ***  Following the imaging study,  I've recommended a cystoscopy. I described how this is performed, typically in an office setting with a flexible cystoscope. We described the risks, benefits, and possible side effects, the most common of which is a minor amount of blood in the urine and/or burning which usually resolves in 24 to 48 hours.  *** There is a 20% risk with gross hematuria and a 2% to 5% risk with  microscopic hematuria of missing a bladder cancer.  Our goal is to identify the cancer in its early stage so as to give you the greater change of survival and a cure.   *** The patient had the opportunity to ask questions which were answered. Based upon this discussion, the patient is willing to proceed. Therefore, I've ordered: a CT Urogram and cystoscopy. ***  - The patient will return following all of the above for discussion of the results.   - UA ***  - Urine culture ***  - BUN + creatinine  ***    No follow-ups on file.  These notes generated with voice recognition software. I apologize for typographical errors.  Michiel Cowboy, PA-C  [facility] [address]  [place], [place] [address] [phone]

## 2018-03-12 ENCOUNTER — Ambulatory Visit: Payer: 59 | Admitting: Urology

## 2018-03-13 ENCOUNTER — Encounter: Payer: Self-pay | Admitting: Urology

## 2018-04-05 ENCOUNTER — Other Ambulatory Visit: Payer: Self-pay | Admitting: Family Medicine

## 2018-07-21 ENCOUNTER — Other Ambulatory Visit: Payer: Self-pay | Admitting: Obstetrics and Gynecology

## 2018-09-15 ENCOUNTER — Encounter: Payer: Self-pay | Admitting: Family Medicine

## 2018-09-15 ENCOUNTER — Ambulatory Visit (INDEPENDENT_AMBULATORY_CARE_PROVIDER_SITE_OTHER): Payer: 59 | Admitting: Family Medicine

## 2018-09-15 VITALS — BP 110/64 | HR 81 | Temp 98.5°F | Ht 63.5 in | Wt 136.9 lb

## 2018-09-15 DIAGNOSIS — E559 Vitamin D deficiency, unspecified: Secondary | ICD-10-CM | POA: Diagnosis not present

## 2018-09-15 DIAGNOSIS — N921 Excessive and frequent menstruation with irregular cycle: Secondary | ICD-10-CM | POA: Diagnosis not present

## 2018-09-15 DIAGNOSIS — F4321 Adjustment disorder with depressed mood: Secondary | ICD-10-CM

## 2018-09-15 NOTE — Patient Instructions (Signed)
Let's have you see Dr. Maryruth Bun (psychiatrist) and also work with a therapist Let's get labs today  Here are some resources to help you if you feel you are in a mental health crisis:  National Suicide Prevention Lifeline - Call 440-842-5720  for help - Website with more resources: ARanked.fi  [employer] Crisis [facility] - Call 931-356-6002 for help. - Mobile Crisis Program available 24 hours a day, 365 days a year. - Available for anyone of any age in [place] counties.  [facility] - Address: [address], [place] - Telephone: [phone]  - Hours of Operation: Sunday - Saturday - 8:00 a.m. - 8:00 p.m. - Medicaid, Medicare (Government Issued Only), BCBS, and [employer] - Pay - Crisis Management, Outpatient Individual & Group Therapy, Psychiatrists on-site to provide medication management, In-Home Psychiatric Care, and Peer Support Care.  [facility] - Call [phone] for help. - Mobile Crisis Program available 24 hours a day, 365 days a year. - Available for anyone of any age in [place] & [facility]

## 2018-09-15 NOTE — Progress Notes (Signed)
BP 110/64   Pulse 81   Temp 98.5 F (36.9 C) (Oral)   Ht 5' 3.5" (1.613 m)   Wt 136 lb 14.4 oz (62.1 kg)   LMP 09/11/2018   SpO2 96%   BMI 23.87 kg/m    Subjective:    Patient ID: Amber Gibson, female    DOB: July 21, 2001, 18 y.o.   MRN: [id number]  HPI: Amber Gibson is a 18 y.o. female  Chief Complaint  Patient presents with  . Depression    HPI Patient is here for depression Here with her mother Struggled with depression one and off since 14 years No hx of seeing psychologist or psychiatrist Issue revolves around living with her boyfriend who is overly critical; she doesn't feel like she can do anything right Not eating well Not sleeping Tearful a lot Moved in with him last June Can't concentrate; affecting school Affecting work too Work is affected too; no abuse Gained weight; last TSH and free T4 reviewed No thyroid disease in the family Rash on the leg Stretch marks, dark skinned Energy level is kind of poor Not much sun exposure  Depression screen [name] 2/9 09/15/2018 02/04/2018  Decreased Interest 2 0  Down, Depressed, Hopeless 3 0  PHQ - 2 Score 5 0  Altered sleeping 2 -  Tired, decreased energy 2 -  Change in appetite 3 -  Feeling bad or failure about yourself  1 -  Trouble concentrating 2 -  Moving slowly or fidgety/restless 0 -  Suicidal thoughts 1 -  PHQ-9 Score 16 -  Difficult doing work/chores Somewhat difficult -  MD note: she tells me that she is not planning to hurt herself or anyone else  Fall Risk  09/15/2018 02/04/2018  Falls in the past year? 0 No  Number falls in past yr: 0 -  Injury with Fall? 0 -   Relevant past medical, surgical, family and social history reviewed Past Medical History:  Diagnosis Date  . Menometrorrhagia   . Orthostatic proteinuria   . Vitamin D deficiency    Past Surgical History:  Procedure Laterality Date  . NO PAST SURGERIES     Family History  Problem Relation Age of Onset  . Lupus  Maternal Grandmother   . Diabetes Maternal Grandmother   . Heart disease Maternal Grandmother   . Hypertension Maternal Grandmother   . COPD Maternal Grandmother   . Diabetes Maternal Grandfather   . Heart disease Maternal Grandfather   . Hypertension Maternal Grandfather   . Cancer Neg Hx   . Stroke Neg Hx    Social History   Tobacco Use  . Smoking status: Never Smoker  . Smokeless tobacco: Never Used  Substance Use Topics  . Alcohol use: No    Alcohol/week: 0.0 standard drinks  . Drug use: No     Office Visit from 09/15/2018 in [facility]  AUDIT-C Score  0     Interim medical history since last visit reviewed. Allergies and medications reviewed  Review of Systems Per HPI unless specifically indicated above     Objective:    BP 110/64   Pulse 81   Temp 98.5 F (36.9 C) (Oral)   Ht 5' 3.5" (1.613 m)   Wt 136 lb 14.4 oz (62.1 kg)   LMP 09/11/2018   SpO2 96%   BMI 23.87 kg/m   Wt Readings from Last 3 Encounters:  09/15/18 136 lb 14.4 oz (62.1 kg) (66 %, Z= 0.40)*  02/11/18  139 lb (63 kg) (71 %, Z= 0.54)*  02/04/18 137 lb 8 oz (62.4 kg) (69 %, Z= 0.49)*   * Growth percentiles are based on CDC (Girls, 2-20 Years) data.    Physical Exam Constitutional:      Appearance: She is well-developed.  Eyes:     General: No scleral icterus. Cardiovascular:     Rate and Rhythm: Normal rate and regular rhythm.  Pulmonary:     Effort: Pulmonary effort is normal.     Breath sounds: Normal breath sounds.  Psychiatric:        Mood and Affect: Mood is anxious and depressed. Affect is tearful. Affect is not blunt, flat or inappropriate.        Speech: Speech is not delayed or tangential.        Behavior: Behavior normal. Behavior is not slowed, aggressive or withdrawn.        Thought Content: Thought content does not include homicidal or suicidal ideation.       Assessment & Plan:   Problem List Items Addressed This Visit      Other    Menometrorrhagia    Check CBC, in case low H/H causing fatigue      Relevant Orders   CBC (Completed)    Other Visit Diagnoses    Adjustment disorder with depressed mood    -  Primary   dealing with overcritical boyfriend; no thoughts of SI/HI; refer to psychiatrist; encouraged counseling; she will move in with mother for the time-being   Relevant Orders   Ambulatory referral to Psychiatry   Vitamin D deficiency       check level and supplement if needed, as low vit D can exacerbate depressive symptoms   Relevant Orders   VITAMIN D 25 Hydroxy (Vit-D Deficiency, Fractures) (Completed)      Follow up plan: Return in about 2 weeks (around 09/29/2018) for follow-up visit with Dr. Sherie Don.  An after-visit summary was printed and given to the patient at check-out.  Please see the patient instructions which may contain other information and recommendations beyond what is mentioned above in the assessment and plan.  No orders of the defined types were placed in this encounter.   Orders Placed This Encounter  Procedures  . CBC  . VITAMIN D 25 Hydroxy (Vit-D Deficiency, Fractures)  . Ambulatory referral to Psychiatry

## 2018-09-16 ENCOUNTER — Other Ambulatory Visit: Payer: Self-pay | Admitting: Family Medicine

## 2018-09-16 LAB — CBC
HCT: 40.6 % (ref 35.0–45.0)
Hemoglobin: 13.8 g/dL (ref 11.7–15.5)
MCH: 27.9 pg (ref 27.0–33.0)
MCHC: 34 g/dL (ref 32.0–36.0)
MCV: 82.2 fL (ref 80.0–100.0)
MPV: 10.2 fL (ref 7.5–12.5)
PLATELETS: 319 10*3/uL (ref 140–400)
RBC: 4.94 10*6/uL (ref 3.80–5.10)
RDW: 12.4 % (ref 11.0–15.0)
WBC: 6.5 10*3/uL (ref 3.8–10.8)

## 2018-09-16 LAB — VITAMIN D 25 HYDROXY (VIT D DEFICIENCY, FRACTURES): Vit D, 25-Hydroxy: 18 ng/mL — ABNORMAL LOW (ref 30–100)

## 2018-09-16 MED ORDER — VITAMIN D (ERGOCALCIFEROL) 1.25 MG (50000 UNIT) PO CAPS: 50000.0000 [IU] | ORAL_CAPSULE | ORAL | 1 refills | Status: DC

## 2018-09-25 NOTE — Assessment & Plan Note (Signed)
Check CBC, in case low H/H causing fatigue

## 2018-09-29 ENCOUNTER — Ambulatory Visit: Payer: 59 | Admitting: Family Medicine

## 2018-09-30 ENCOUNTER — Telehealth: Payer: Self-pay

## 2018-09-30 NOTE — Telephone Encounter (Signed)
Copied from CRM [id number]. Topic: General - Other >> Sep 30, 2018  1:39 PM Rica Koyanagi, Barbee Cough wrote: Reason for CRM: I spoke to pt, she says she does not need referral to [place] [facility], sending as fyi for provider.

## 2018-10-07 ENCOUNTER — Other Ambulatory Visit: Payer: Self-pay | Admitting: Family Medicine

## 2018-10-07 NOTE — Telephone Encounter (Signed)
I wrote a prescription on 09/16/2018 of Rx vitamin D that should last for 8 weeks 4 weeks plus a refill She should not be in need of a refill Please contact her and verify that she's taking it correctly

## 2018-10-10 NOTE — Telephone Encounter (Signed)
Left voice mail

## 2018-11-04 ENCOUNTER — Other Ambulatory Visit: Payer: Self-pay | Admitting: Family Medicine

## 2018-11-05 NOTE — Telephone Encounter (Signed)
See last note: "Amber Gibson, please let the patient know that her vitamin D is indeed low; let's start prescription vitamin D once a week for 8 weeks, then she can just take 800 iu daily (vitamin D3)"  Patient needs an appointment ASAP with me or Lanora Manis; she was supposed to have been seen two weeks after last visit

## 2018-11-06 NOTE — Telephone Encounter (Signed)
Left detailed voicemail

## 2018-12-26 IMAGING — US US ART/VEN ABD/PELV/SCROTUM DOPPLER LTD
1 series · 13 of 25 positions shown · non-contrast
Comparison: None.

CLINICAL DATA: Initial evaluation for pelvic pain with heavy
vaginal bleeding.

EXAM:
TRANSABDOMINAL AND TRANSVAGINAL ULTRASOUND OF PELVIS
DOPPLER ULTRASOUND OF OVARIES
TECHNIQUE: Both transabdominal and transvaginal ultrasound examinations of the
pelvis were performed. Transabdominal technique was performed for
global imaging of the pelvis including uterus, ovaries, adnexal
regions, and pelvic cul-de-sac.
It was necessary to proceed with endovaginal exam following the
transabdominal exam to visualize the uterus, endometrium, and
ovaries. Color and duplex Doppler ultrasound was utilized to
evaluate blood flow to the ovaries.

[Series 1: us art/ven abd/pelv/scrotum doppler ltd · 13 of 67 slices shown]
[im 1/67]
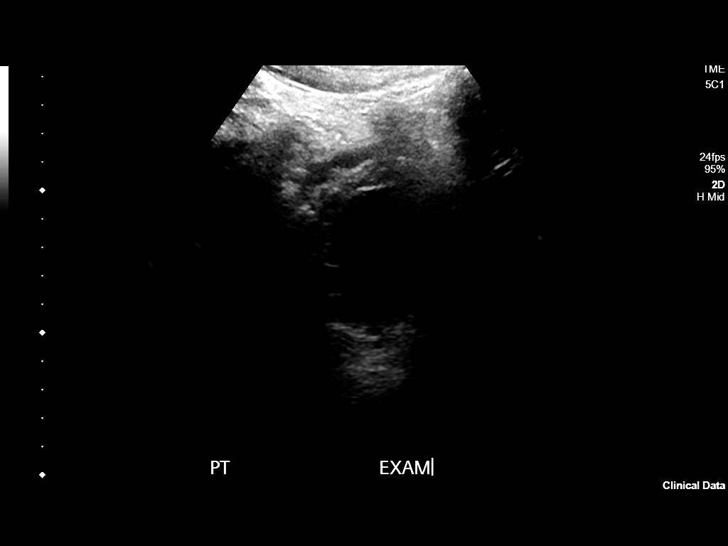
[im 6/67]
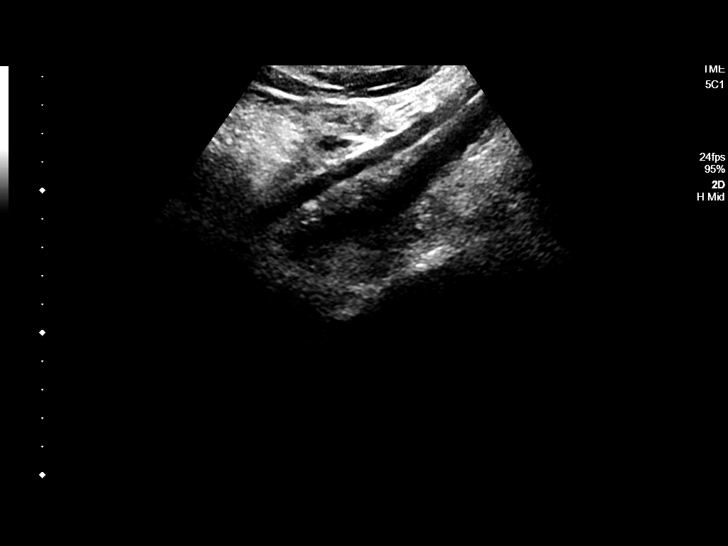
[im 12/67]
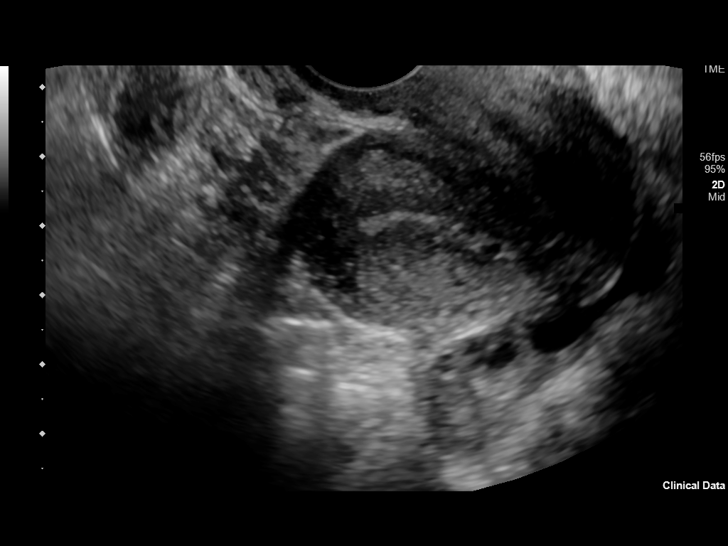
[im 17/67]
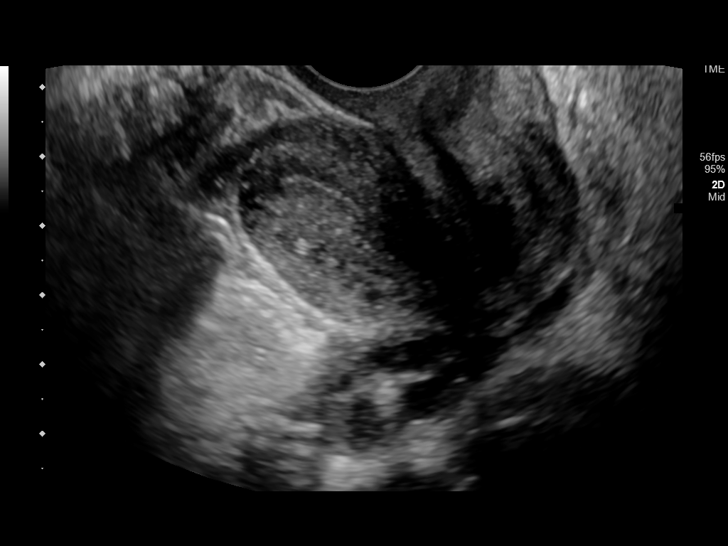
[im 23/67]
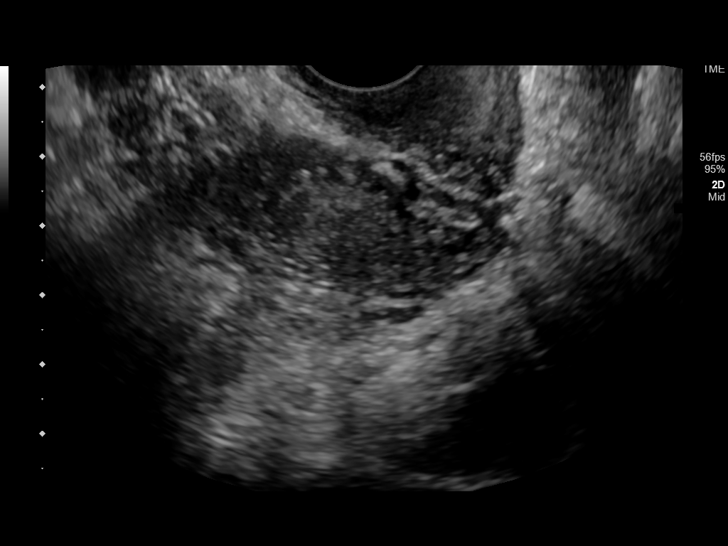
[im 28/67]
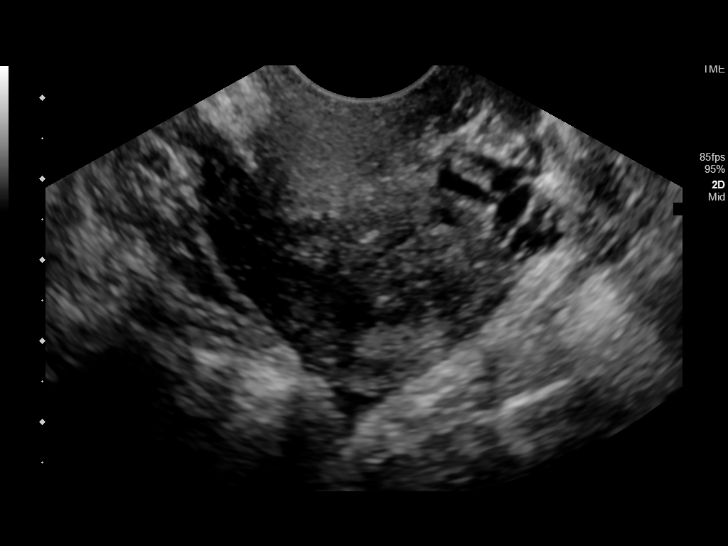
[im 34/67]
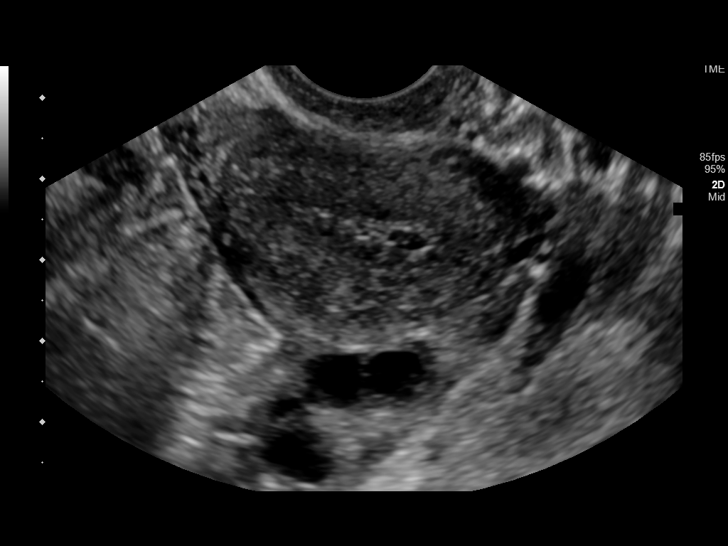
[im 39/67]
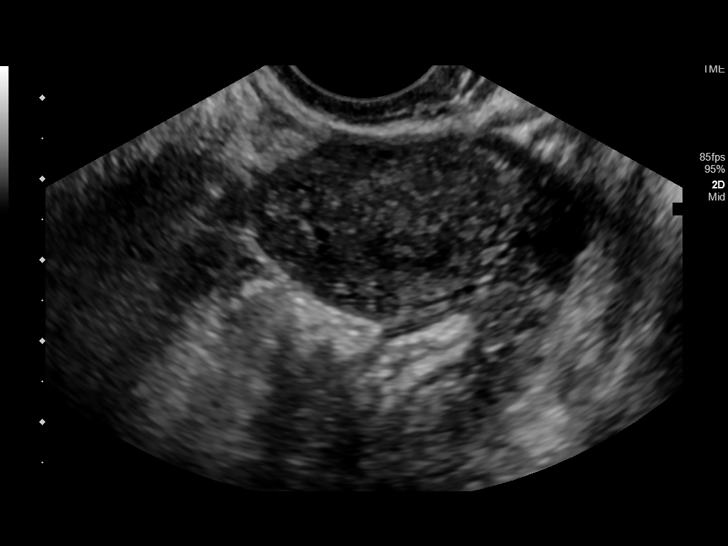
[im 45/67]
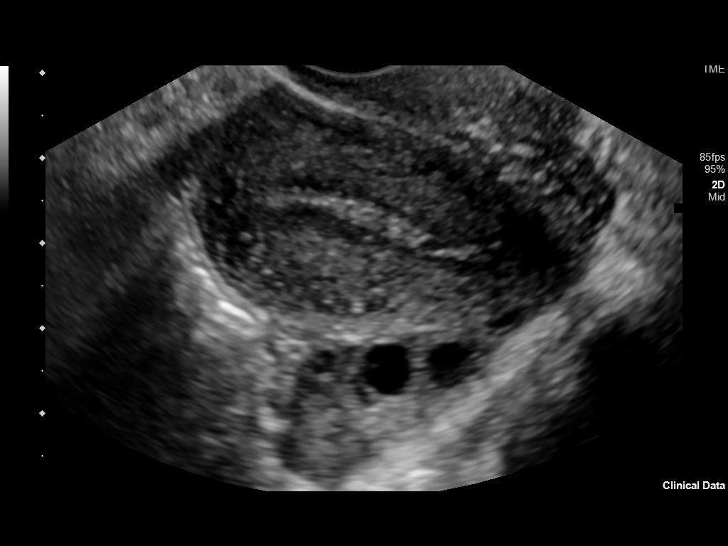
[im 50/67]
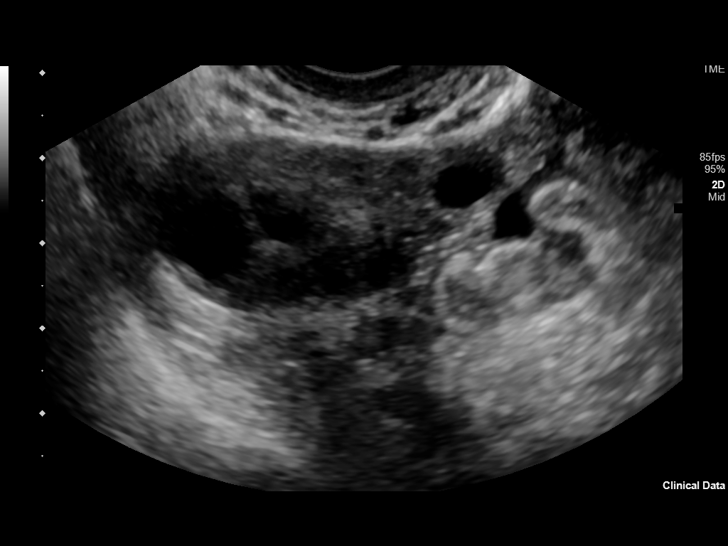
[im 56/67]
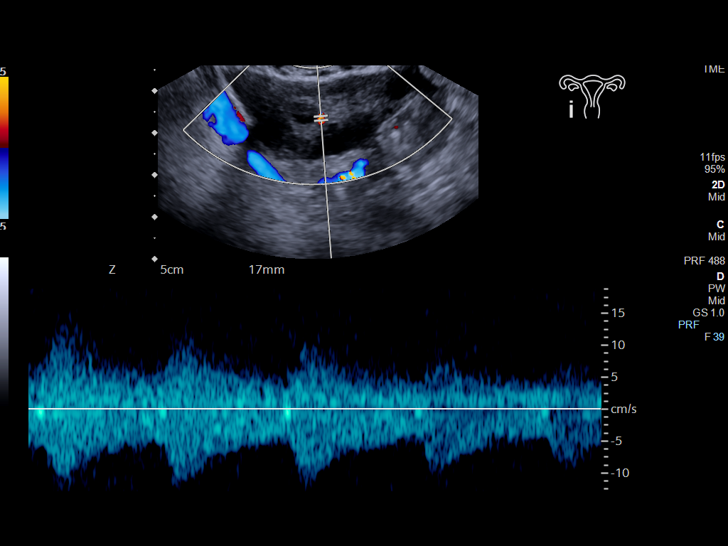
[im 61/67]
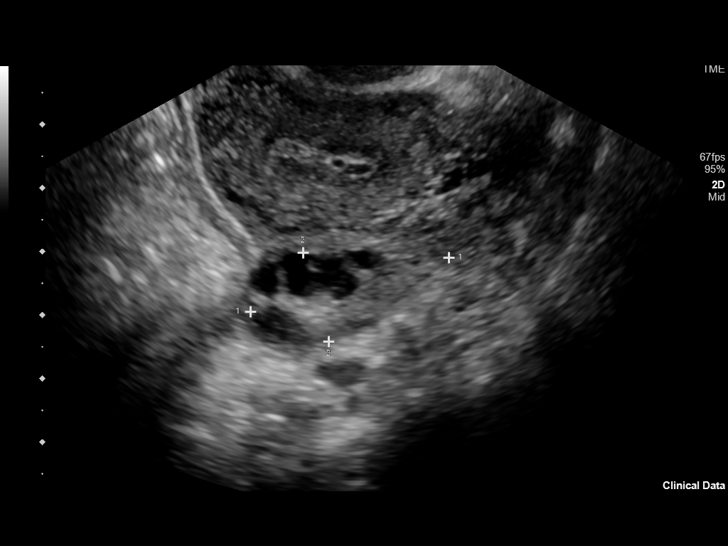
[im 67/67]
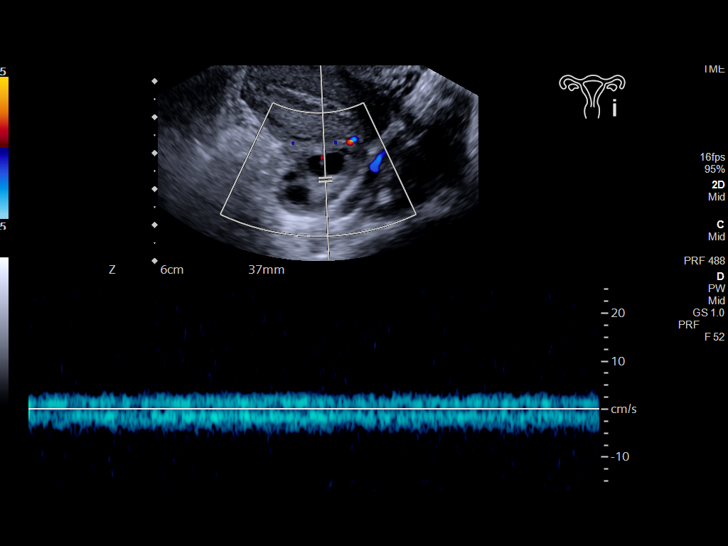

[13 of 25 positions shown; findings below may reference images not displayed]

FINDINGS: Uterus

Measurements: 4.9 x 2.7 x 3.8 cm. No fibroids or other mass
visualized.

Endometrium

Thickness: 3.2 mm.  No focal abnormality visualized.

Right ovary

Measurements: 4.3 x 2.1 x 2.8 cm. Normal appearance/no adnexal mass.

Left ovary

Measurements: 3.2 x 1.5 x 1.9 cm. Normal appearance/no adnexal mass.

Pulsed Doppler evaluation of both ovaries demonstrates normal
low-resistance arterial and venous waveforms.

Other findings

No abnormal free fluid.
IMPRESSION: Normal pelvic ultrasound. No evidence for torsion or other acute
abnormality.

## 2019-02-17 ENCOUNTER — Telehealth: Payer: Self-pay

## 2019-02-17 NOTE — Telephone Encounter (Signed)
Pt calling; has appt tomorrow; is spotting and cramping.  [phone]  Adv spotting doesn't always mean miscarriage; happens to a lot of women; monitor and if becomes heavy like a period to go to ED; otherwise keep appt tomorrow.

## 2019-02-18 ENCOUNTER — Ambulatory Visit (INDEPENDENT_AMBULATORY_CARE_PROVIDER_SITE_OTHER): Payer: 59

## 2019-02-18 ENCOUNTER — Other Ambulatory Visit (HOSPITAL_COMMUNITY)
Admission: RE | Admit: 2019-02-18 | Discharge: 2019-02-18 | Disposition: A | Discharge status: Home / Self Care | Payer: 59 | Source: Ambulatory Visit | Attending: Obstetrics and Gynecology | Admitting: Obstetrics and Gynecology

## 2019-02-18 ENCOUNTER — Ambulatory Visit (INDEPENDENT_AMBULATORY_CARE_PROVIDER_SITE_OTHER): Payer: 59 | Admitting: Obstetrics and Gynecology

## 2019-02-18 ENCOUNTER — Other Ambulatory Visit: Payer: Self-pay

## 2019-02-18 ENCOUNTER — Encounter: Payer: Self-pay | Admitting: Obstetrics and Gynecology

## 2019-02-18 VITALS — BP 118/62 | Wt 128.0 lb

## 2019-02-18 DIAGNOSIS — R1110 Vomiting, unspecified: Secondary | ICD-10-CM

## 2019-02-18 DIAGNOSIS — Z3A01 Less than 8 weeks gestation of pregnancy: Secondary | ICD-10-CM

## 2019-02-18 DIAGNOSIS — Z3400 Encounter for supervision of normal first pregnancy, unspecified trimester: Secondary | ICD-10-CM | POA: Insufficient documentation

## 2019-02-18 DIAGNOSIS — Z3401 Encounter for supervision of normal first pregnancy, first trimester: Secondary | ICD-10-CM

## 2019-02-18 DIAGNOSIS — N8312 Corpus luteum cyst of left ovary: Secondary | ICD-10-CM | POA: Diagnosis not present

## 2019-02-18 DIAGNOSIS — O3481 Maternal care for other abnormalities of pelvic organs, first trimester: Secondary | ICD-10-CM | POA: Diagnosis not present

## 2019-02-18 MED ORDER — BONJESTA 20-20 MG PO TBCR: 1.0000 | EXTENDED_RELEASE_TABLET | Freq: Two times a day (BID) | ORAL | 11 refills | Status: AC

## 2019-02-18 NOTE — Progress Notes (Signed)
NOB C/o nausea and vomiting, some cramping and brown spotting yesterday

## 2019-02-18 NOTE — Progress Notes (Signed)
02/18/2019   Chief Complaint: Missed period  Transfer of Care Patient: no  History of Present Illness: Amber Gibson is a 18 y.o. G1P0000 [redacted]w[redacted]d based on Patient's last menstrual period was 01/09/2019 (exact date). with an Estimated Date of Delivery: 10/16/19, with the above CC.   Her periods were: regular periods every 28 days She was using no method when she conceived.  She has Positive signs or symptoms of nausea/vomiting of pregnancy. She has Negative signs or symptoms of miscarriage or preterm labor She was not taking different medications around the time she conceived/early pregnancy. Since her LMP, she has not used alcohol Since her LMP, she has not used tobacco products Since her LMP, she has not used illegal drugs.    Current or past history of domestic violence. no  Infection History:  1. Since her LMP, she has not had a viral illness.  2. She denies close contact with children on a regular basis     3. She denies a history of chicken pox. She is unsure vaccination for chicken pox in the past. 4. Patient or partner has history of genital herpes  no 5. History of STI (GC, CT, HPV, syphilis, HIV)  no   6.  She does not live with someone with TB or TB exposed. 7. History of recent travel :  no 8. She identifies Negative Zika risk factors for her and her partner 74. There are not cats in the home in the home.  She understands that while pregnant she should not change cat litter.   Genetic Screening Questions: (Includes patient, baby's father, or anyone in either family)   1. Patient's age >/= 68 [facility]  no 2. Thalassemia ([place], [place], [place], or Asian background): MCV<80  no 3. Neural tube defect (meningomyelocele, spina bifida, anencephaly)  no 4. Congenital heart defect  no  5. Down syndrome  no 6. Tay-Sachs (Jewish, [place])  no 7. Canavan's Disease  no 8. Sickle cell disease or trait (African)  She has a cousin with sickle cell disease  9. Hemophilia or  other blood disorders  no  10. Muscular dystrophy  no  11. Cystic fibrosis  no  12. Huntington's Chorea  no  13. Mental retardation/autism  no 14. Other inherited genetic or chromosomal disorder  no 15. Maternal metabolic disorder (DM, PKU, etc)  FOB's Father (paternal grandfather of this pregnancy) has Type 1 DM 16. Patient or FOB with a child with a birth defect not listed above no  16a. Patient or FOB with a birth defect themselves no 17. Recurrent pregnancy loss, or stillbirth  no  18. Any medications since LMP other than prenatal vitamins (include vitamins, supplements, OTC meds, drugs, alcohol)  no 19. Any other genetic/environmental exposure to discuss  no  ROS:  ROS  OBGYN History: As per HPI. OB History  Gravida Para Term Preterm AB Living  1 0 0 0 0 0  SAB TAB Ectopic Multiple Live Births  0 0 0 0      # Outcome Date GA Lbr Len/2nd Weight Sex Delivery Anes PTL Lv  1 Current             Any issues with any prior pregnancies: not applicable Any prior children are healthy, doing well, without any problems or issues: not applicable Last pap smear UNDER 21 History of STIs: No   Past Medical History: Past Medical History:  Diagnosis Date  . Menometrorrhagia   . Orthostatic proteinuria   . Vitamin D  deficiency     Past Surgical History: Past Surgical History:  Procedure Laterality Date  . NO PAST SURGERIES      Family History:  Family History  Problem Relation Age of Onset  . Lupus Maternal Grandmother   . Diabetes Maternal Grandmother   . Heart disease Maternal Grandmother   . Hypertension Maternal Grandmother   . COPD Maternal Grandmother   . Diabetes Maternal Grandfather   . Heart disease Maternal Grandfather   . Hypertension Maternal Grandfather   . Cancer Neg Hx   . Stroke Neg Hx    She denies any female cancers, bleeding or blood clotting disorders.   Social History:  Social History   Socioeconomic History  . Marital status: Single    Spouse  name: Not on file  . Number of children: Not on file  . Years of education: Not on file  . Highest education level: Not on file  Occupational History  . Not on file  Social Needs  . Financial resource strain: Not on file  . Food insecurity    Worry: Not on file    Inability: Not on file  . Transportation needs    Medical: Not on file    Non-medical: Not on file  Tobacco Use  . Smoking status: Never Smoker  . Smokeless tobacco: Never Used  Substance and Sexual Activity  . Alcohol use: No    Alcohol/week: 0.0 standard drinks  . Drug use: No  . Sexual activity: Yes    Birth control/protection: None  Lifestyle  . Physical activity    Days per week: Not on file    Minutes per session: Not on file  . Stress: Not on file  Relationships  . Social Musician on phone: Not on file    Gets together: Not on file    Attends religious service: Not on file    Active member of club or organization: Not on file    Attends meetings of clubs or organizations: Not on file    Relationship status: Not on file  . Intimate partner violence    Fear of current or ex partner: Not on file    Emotionally abused: Not on file    Physically abused: Not on file    Forced sexual activity: Not on file  Other Topics Concern  . Not on file  Social History Narrative  . Not on file    Allergy: No Known Allergies  Current Outpatient Medications:  Current Outpatient Medications:  .  naproxen (NAPROSYN) 500 MG tablet, TAKE 1 TABLET (500 MG TOTAL) 2 (TWO) TIMES DAILY WITH A MEAL BY MOUTH. AS NEEDED FOR PAIN (Patient not taking: Reported on 02/18/2019), Disp: 180 tablet, Rfl: 2 .  Vitamin D, Ergocalciferol, (DRISDOL) 1.25 MG (50000 UT) CAPS capsule, Take 1 capsule (50,000 Units total) by mouth every 7 (seven) days. (Patient not taking: Reported on 02/18/2019), Disp: 4 capsule, Rfl: 1   Physical Exam: Physical Exam  Constitutional: She is oriented to person, place, and time and well-developed,  well-nourished, and in no distress.  HENT:  Head: Normocephalic and atraumatic.  Eyes: Pupils are equal, round, and reactive to light.  Neck: Normal range of motion. Neck supple. No thyromegaly present.  Cardiovascular: Normal rate and regular rhythm.  Pulmonary/Chest: Effort normal.  Abdominal: Soft. Bowel sounds are normal. She exhibits no distension. There is no abdominal tenderness. There is no rebound and no guarding.  Genitourinary:    Vagina and cervix normal.  Musculoskeletal: Normal range of motion.  Neurological: She is alert and oriented to person, place, and time.  Skin: Skin is warm and dry.  Psychiatric: Affect and judgment normal.  Nursing note and vitals reviewed.    Assessment: Amber Gibson is a 18 y.o. G1P0000 3536w5d based on Patient's last menstrual period was 01/09/2019 (exact date). with an Estimated Date of Delivery: 10/16/19,  for prenatal care.  Plan:  1) Avoid alcoholic beverages. 2) Patient encouraged not to smoke.  3) Discontinue the use of all non-medicinal drugs and chemicals.  4) Take prenatal vitamins daily.  5) Seatbelt use advised 6) Nutrition, food safety (fish, cheese advisories, and high nitrite foods) and exercise discussed. 7) Hospital and [facility] discussed  8) Patient is asked about travel to areas at risk for the Zika virus, and counseled to avoid travel and exposure to mosquitoes or sexual partners who may have themselves been exposed to the virus. Testing is discussed, and will be ordered as appropriate.  9) [facility]birth [facility] advised 10) Genetic Screening, such as with 1st Trimester Screening, cell free fetal DNA, AFP testing, and Ultrasound, as well as with amniocentesis and CVS as appropriate, is discussed with patient. She plans to have genetic testing this pregnancy.  [facility] Discussed hyperemesis and BRAT diet and other interventions for nausea. Discussed when to reach out if  dehydration or rapid weight loss (>5lbs) occurs.   Problem list reviewed and updated.  I discussed the assessment and treatment plan with the patient. The patient was provided an opportunity to ask questions and all were answered. The patient agreed with the plan and demonstrated an understanding of the instructions.  Adelene Idlerhristanna Karlos Scadden MD [facility], [facility] 02/18/2019 8:33 AM

## 2019-02-18 NOTE — Patient Instructions (Signed)
 Hello,  Given the current COVID-19 pandemic, our practice is making changes in how we are providing care to our patients. We are limiting in-person visits for the safety of all of our patients.   As a practice, we have met to discuss the best way to minimize visits, but still provide excellent care to our expecting mothers.  We have decided on the following visit structure for low-risk pregnancies.  Initial Pregnancy visit will be conducted as a telephone or web visit.  Between 10-14 weeks  there will be one in-person visit for an ultrasound, lab work, and genetic screening. 20 weeks in-person visit with an anatomy ultrasound  28 weeks in-person office visit for a 1-hour glucose test and a TDAP vaccination 32 weeks in-person office visit 34 weeks telephone visit 36 weeks in-person office visit for GBS, chlamydia, and gonorrhea testing 38 weeks in-person office visit 40 weeks in-person office visit  Understandably, some patients will require more visits than what is outlined above. Additional visits will be determined on a case-by-case basis.   We will, as always, be available for emergencies or to address concerns that might arise between in-person visits. We ask that you allow us the opportunity to address any concerns over the phone or through a virtual visit first. We will be available to return your phone calls throughout the day.   If you are able to purchase a scale, a blood pressure machine, and a home fetal doppler visits could be limited further. This will help decrease your exposure risks, but these purchases are not a necessity.   Things seem to change daily and there is the possibility that this structure could change, please be patient as we adapt to a new way of caring for patients.   Thank you for trusting us with your prenatal care. Our practice values you and looks forward to providing you with excellent care.   Sincerely,   [facility], [facility]      COVID-19 and Your Pregnancy FAQ  How can I prevent infection with COVID-19 during my pregnancy? Social distancing is key. Please limit any interactions in public. Try and work from home if possible. Frequently wash your hands after touching possibly contaminated surfaces. Avoid touching your face.  Minimize trips to the store. Consider online ordering when possible.   Should I wear a mask? YES. It is recommended by the CDC that all people wear a cloth mask or facial covering in public. You should wear a mask to your visits in the office. This will help reduce transmission as well as your risk or acquiring COVID-19. New studies are showing that even asymptomatic individuals can spread the virus from talking.   Where can I get a mask? [place] and the city of [place] are partnering to provide masks to community members. You can pick up a mask from several locations. This website also has instructions about how to make a mask by sewing or without sewing by using a t-shirt or bandana.  https://www.[place].gov/i-want-to/learn-about/covid-19-information-and-updates/covid-19-face-mask-project  Studies have shown that if you were a tube or nylon stocking from pantyhose over a cloth mask it makes the cloth mask almost as effective as a N95 mask.  https://www.npr.org/sections/goatsandsoda/2018/12/17/840146830/adding-a-nylon-stocking-layer-could-boost-protection-from-cloth-masks-study-find  What are the symptoms of COVID-19? Fever (greater than 100.4 F), dry cough, shortness of breath.  Am I more at risk for COVID-19 since I am pregnant? There is not currently data showing that pregnant women are more adversely impacted by COVID-19 than the general population. However,   we know that pregnant women tend to have worse respiratory complications from similar diseases such as the flu and SARS and for this reason should be considered an at-risk population.  What do I do if I am  experiencing the symptoms of COVID-19? Testing is being limited because of test availability. If you are experiencing symptoms you should quarantine yourself, and the members of your family, for at least 2 weeks at home.   Please visit this website for more information: https://www.cdc.gov/coronavirus/2019-ncov/if-you-are-sick/steps-when-sick.html  When should I go to the Emergency Room? Please go to the emergency room if you are experiencing ANY of these symptoms*:  1.    Difficulty breathing or shortness of breath 2.    Persistent pain or pressure in the chest 3.    Confusion or difficulty being aroused (or awakened) 4.    Bluish lips or face  *This list is not all inclusive. Please consult our office for any other symptoms that are severe or concerning.  What do I do if I am having difficulty breathing? You should go to the Emergency Room for evaluation. At this time they have a tent set up for evaluating patients with COVID-19 symptoms.   How will my prenatal care be different because of the COVID-19 pandemic? It has been recommended to reduce the frequency of face-to-face visits and use resources such as telephone and virtual visits when possible. Using a scale, blood pressure machine and fetal doppler at home can further help reduce face-to-face visits. You will be provided with additional information on this topic.  We ask that you come to your visits alone to minimize potential exposures to  COVID-19.  How can I receive childbirth education? At this time in-person classes have been cancelled. You can register for online childbirth education, breastfeeding, and newborn care classes.  Please visit:  [email or web address] for more information  How will my hospital birth experience be different? The hospital is currently limiting visitors. This means that while you are in labor you can only have one person at the hospital with you. Additional family members will not be allowed  to wait in the building or outside your room. Your one support person can be the father of the baby, a relative, a doula, or a friend. Once one support person is designated that person will wear a band. This band cannot be shared with multiple people.  Nitrous Gas is not being offered for pain relief since the tubing and filter for the machine can not be sanitized in a way to guarantee prevention of transmission of COVID-19.  Nasal cannula use of oxygen for fetal indications has also been discontinued.  Currently a clear plastic sheet is being hung between mom and the delivering provider during pushing and delivery to help prevent transmission of COVID-19.      How long will I stay in the hospital for after giving birth? It is also recommended that discharge home be expedited during the COVID-19 outbreak. This means staying for 1 day after a vaginal delivery and 2 days after a cesarean section. Patients who need to stay longer for medical reasons are allowed to do so, but the goal will be for expedited discharge home.   What if I have COVID-19 and I am in labor? We ask that you wear a mask while on labor and delivery. We will try and accommodate you being placed in a room that is capable of filtering the air. Please call ahead if you are in labor and on your   way to the hospital. The phone number for labor and delivery at [facility] is [phone].  If I have COVID-19 when my baby is born how can I prevent my baby from contracting COVID-19? This is an issue that will have to be discussed on a case-by-case basis. Current recommendations suggest providing separate isolation rooms for both the mother and new infant as well as limiting visitors. However, there are practical challenges to this recommendation. The situation will assuredly change and decisions will be influenced by the desires of the mother and availability of space.  Some suggestions are the use of a curtain or  physical barrier between mom and infant, hand hygiene, mom wearing a mask, or 6 feet of spacing between a mom and infant.   Can I breastfeed during the COVID-19 pandemic?   Yes, breastfeeding is encouraged.  Can I breastfeed if I have COVID-19? Yes. Covid-19 has not been found in breast milk. This means you cannot give COVID-19 to your child through breast milk. Breast feeding will also help pass antibodies to fight infection to your baby.   What precautions should I take when breastfeeding if I have COVID-19? If a mother and newborn do room-in and the mother wishes to feed at the breast, she should put on a facemask and practice hand hygiene before each feeding.  What precautions should I take when pumping if I have COVID-19? Prior to expressing breast milk, mothers should practice hand hygiene. After each pumping session, all parts that come into contact with breast milk should be thoroughly washed and the entire pump should be appropriately disinfected per the manufacturer's instructions. This expressed breast milk should be fed to the newborn by a healthy caregiver.  What if I am pregnant and work in healthcare? Based on limited data regarding COVID-19 and pregnancy, ACOG currently does not propose creating additional restrictions on pregnant health care personnel because of COVID-19 alone. Pregnant women do not appear to be at higher risk of severe disease related to COVID-19. Pregnant health care personnel should follow CDC risk assessment and infection control guidelines for health care personnel exposed to patients with suspected or confirmed COVID-19. Adherence to recommended infection prevention and control practices is an important part of protecting all health care personnel in health care settings.    Information on COVID-19 in pregnancy is very limited; however, facilities may want to consider limiting exposure of pregnant health care personnel to patients with confirmed or suspected  COVID-19 infection, especially during higher-risk procedures (eg, aerosol-generating procedures), if feasible, based on staffing availability.    

## 2019-02-19 LAB — CERVICOVAGINAL ANCILLARY ONLY
Chlamydia: NEGATIVE
Neisseria Gonorrhea: NEGATIVE

## 2019-02-19 NOTE — Progress Notes (Signed)
Please call patient with normal result.

## 2019-02-20 ENCOUNTER — Telehealth: Payer: Self-pay

## 2019-02-20 LAB — RPR+RH+ABO+RUB AB+AB SCR+CB...
Antibody Screen: NEGATIVE
HIV Screen 4th Generation wRfx: NONREACTIVE
Hematocrit: 39.7 % (ref 34.0–46.6)
Hemoglobin: 13.3 g/dL (ref 11.1–15.9)
Hepatitis B Surface Ag: NEGATIVE
MCH: 28.1 pg (ref 26.6–33.0)
MCHC: 33.5 g/dL (ref 31.5–35.7)
MCV: 84 fL (ref 79–97)
Platelets: 299 10*3/uL (ref 150–450)
RBC: 4.74 x10E6/uL (ref 3.77–5.28)
RDW: 12.3 % (ref 11.7–15.4)
RPR Ser Ql: NONREACTIVE
Rh Factor: POSITIVE
Rubella Antibodies, IGG: 8.23 index (ref >0.99)
Varicella zoster IgG: 460 index (ref >165)
WBC: 7.5 10*3/uL (ref 3.4–10.8)

## 2019-02-20 LAB — HEMOGLOBINOPATHY EVALUATION
HGB C: 0 %
HGB S: 0 %
HGB VARIANT: 0 %
Hemoglobin A2 Quantitation: 2 % (ref 1.8–3.2)
Hemoglobin F Quantitation: 0 % (ref 0.0–2.0)
Hgb A: 98 % (ref 96.4–98.8)

## 2019-02-20 LAB — URINE CULTURE

## 2019-02-20 NOTE — Telephone Encounter (Signed)
Gregary Signs calling from [employer]; wants to be sure PA on Lyla Son was received; if not please call [phone] opt 3

## 2019-02-25 NOTE — Telephone Encounter (Signed)
Gregary Signs calling from [employer] regarding PA for Shafer; they have checked with the insurance - there is no PA.  [phone] opt 3

## 2019-02-26 LAB — MONITOR DRUG PROFILE 10(MW)
Amphetamine Scrn, Ur: NEGATIVE ng/mL
BARBITURATE SCREEN URINE: NEGATIVE ng/mL
BENZODIAZEPINE SCREEN, URINE: NEGATIVE ng/mL
Cocaine (Metab) Scrn, Ur: NEGATIVE ng/mL
Creatinine(Crt), U: 299.5 mg/dL (ref 20.0–300.0)
Methadone Screen, Urine: NEGATIVE ng/mL
OXYCODONE+OXYMORPHONE UR QL SCN: NEGATIVE ng/mL
Opiate Scrn, Ur: NEGATIVE ng/mL
Ph of Urine: 5.7 (ref 4.5–8.9)
Phencyclidine Qn, Ur: NEGATIVE ng/mL
Propoxyphene Scrn, Ur: NEGATIVE ng/mL

## 2019-02-26 LAB — CANNABINOID (GC/MS), URINE

## 2019-03-03 NOTE — Telephone Encounter (Signed)
I called and pharmacy stated they have the PA and are waiting for a confirmation. I called the pt to let her know we are waiting on the insurance to accept it. Pt stated that the medication wasn't helping her at all anyway and her taking B-6 has helped more she asked if I would cancel the PA because she will not be needing the medication anymore.

## 2019-03-04 ENCOUNTER — Other Ambulatory Visit: Payer: Self-pay

## 2019-03-04 ENCOUNTER — Ambulatory Visit (INDEPENDENT_AMBULATORY_CARE_PROVIDER_SITE_OTHER): Payer: 59 | Admitting: Maternal Newborn

## 2019-03-04 ENCOUNTER — Encounter: Payer: Self-pay | Admitting: Maternal Newborn

## 2019-03-04 DIAGNOSIS — Z3A01 Less than 8 weeks gestation of pregnancy: Secondary | ICD-10-CM

## 2019-03-04 DIAGNOSIS — Z3401 Encounter for supervision of normal first pregnancy, first trimester: Secondary | ICD-10-CM

## 2019-03-04 DIAGNOSIS — Z3400 Encounter for supervision of normal first pregnancy, unspecified trimester: Secondary | ICD-10-CM | POA: Insufficient documentation

## 2019-03-04 NOTE — Progress Notes (Signed)
Virtual Visit via Telephone Note  I connected with Amber Gibson on 03/04/19 at 11:04 AM EDT by telephone and verified that I am speaking with the correct person using two identifiers.  Location: Patient: Home Provider: Office   I discussed the limitations of performing an evaluation and management service by telephone and the availability of in person appointments.The patient agreed to proceed.  See note below for documentation of the prenatal telephone visit.   Subjective  Amber Gibson is a 18 y.o. G1P0000 at [redacted]w[redacted]d being seen today for ongoing prenatal care.  She is currently monitored for the following issues for this low-risk pregnancy and has Menometrorrhagia and Supervision of normal first pregnancy, antepartum on their problem list.  ----------------------------------------------------------------------------------- Patient reports nausea improved with Unisom and B6. She is able to eat and drink normally. She has some dizziness when getting out of bed in the morning, which resolves after she has been up and around.  Vag. Bleeding: None.    ----------------------------------------------------------------------------------- The following portions of the patient's history were reviewed and updated as appropriate: allergies, current medications, past family history, past medical history, past social history, past surgical history and problem list. Problem list updated.   Objective  Last menstrual period 01/09/2019. Pregravid weight 128 lb (58.1 kg) Total Weight Gain 0 lb (0 kg)  Physical exam could not be done as this was a telephone visit completed during COVID-19 precautions.  Assessment   18 y.o. G1P0000 at [redacted]w[redacted]d EDD 10/16/2019, by Last Menstrual Period presenting for a  prenatal telephone visit.  Plan   Pregnancy #1 Problems (from 01/09/19 to present)    No problems associated with this episode.     She has decided to decline genetic screening for this  pregnancy.  I discussed the assessment and treatment plan with the patient. The patient was provided an opportunity to ask questions. The patient agreed with the plan and demonstrated an understanding of the instructions.   The patient was advised to call back or seek an in-person evaluation as needed.  I provided 5:17 minutes of non-face-to-face time during this encounter.  Please refer to After Visit Summary for other counseling recommendations.   Return in about 4 weeks (around 04/01/2019) for ROB.  Avel Sensor, CNM 03/04/2019  12:28 PM

## 2019-03-04 NOTE — Progress Notes (Signed)
ROB- no concerns 

## 2019-03-23 ENCOUNTER — Telehealth: Payer: Self-pay

## 2019-03-23 ENCOUNTER — Other Ambulatory Visit: Payer: Self-pay | Admitting: Maternal Newborn

## 2019-03-23 DIAGNOSIS — O219 Vomiting of pregnancy, unspecified: Secondary | ICD-10-CM

## 2019-03-23 MED ORDER — PROMETHAZINE HCL 25 MG PO TABS: 25.0000 mg | ORAL_TABLET | Freq: Four times a day (QID) | ORAL | 1 refills | Status: DC | PRN

## 2019-03-23 NOTE — Progress Notes (Signed)
Rx for promethazine 

## 2019-03-23 NOTE — Telephone Encounter (Signed)
Rx for Phenergan sent to pharmacy in [place].

## 2019-03-24 ENCOUNTER — Telehealth: Payer: Self-pay

## 2019-03-24 NOTE — Telephone Encounter (Signed)
Pt was tx'd to triage from [place] c/o dizziness wanting to speak to a nurse.  She isn't eating good b/c she is unable to keep it down.  Nausea med has already been called in.  Adv pt to try to trick her body into keeping liquids down by taking a few sips every 10min.  Adv to hold ice in her mouth, eat popsicles.  Adv gatorade since she can't hold down food.  Adv to be seen if unable to keep fluids down for 24hr.  Pt voices understanding.

## 2019-03-24 NOTE — Telephone Encounter (Signed)
Pt aware via vm this AM

## 2019-03-30 ENCOUNTER — Telehealth: Payer: Self-pay

## 2019-03-30 NOTE — Telephone Encounter (Signed)
Orlinda Blalock is calling triage on the behalf of pt Amber Gibson. She states pt spoke to [employer] on Thursday 7/30 and told pt the FMLA papers were faxed to [facility]. Pt has called [employer] on Friday and again today and [employer] states they have not received the [facility] papers. Please call patient about this today.

## 2019-03-30 NOTE — Telephone Encounter (Signed)
Called pt to see what was going on [employer] is stating they did not receive the last couple pages of her FMLA so I have re-faxed the forms and told pt to check with them in a little while to see if they have received them. If not to call be back before 5 pm today

## 2019-04-01 ENCOUNTER — Encounter: Payer: 59 | Admitting: Obstetrics and Gynecology

## 2019-05-19 ENCOUNTER — Other Ambulatory Visit: Payer: Self-pay

## 2019-05-19 ENCOUNTER — Emergency Department
Admission: EM | Admit: 2019-05-19 | Discharge: 2019-05-19 | Disposition: A | Discharge status: Home / Self Care | Payer: 59 | Attending: Emergency Medicine | Admitting: Emergency Medicine

## 2019-05-19 DIAGNOSIS — Z79899 Other long term (current) drug therapy: Secondary | ICD-10-CM | POA: Insufficient documentation

## 2019-05-19 DIAGNOSIS — Y999 Unspecified external cause status: Secondary | ICD-10-CM | POA: Insufficient documentation

## 2019-05-19 DIAGNOSIS — S0993XA Unspecified injury of face, initial encounter: Secondary | ICD-10-CM | POA: Diagnosis present

## 2019-05-19 DIAGNOSIS — S01511A Laceration without foreign body of lip, initial encounter: Secondary | ICD-10-CM | POA: Insufficient documentation

## 2019-05-19 DIAGNOSIS — W01190A Fall on same level from slipping, tripping and stumbling with subsequent striking against furniture, initial encounter: Secondary | ICD-10-CM | POA: Diagnosis not present

## 2019-05-19 DIAGNOSIS — Y92003 Bedroom of unspecified non-institutional (private) residence as the place of occurrence of the external cause: Secondary | ICD-10-CM | POA: Insufficient documentation

## 2019-05-19 DIAGNOSIS — Y939 Activity, unspecified: Secondary | ICD-10-CM | POA: Insufficient documentation

## 2019-05-19 MED ORDER — LIDOCAINE-EPINEPHRINE-TETRACAINE (LET) SOLUTION
3.0000 mL | Freq: Once | NASAL | Status: AC
  Administered 2019-05-19: 3 mL via TOPICAL
  Filled 2019-05-19: qty 3

## 2019-05-19 MED ORDER — LIDOCAINE HCL (PF) 1 % IJ SOLN
5.0000 mL | Freq: Once | INTRAMUSCULAR | Status: AC
  Administered 2019-05-19: 5 mL via INTRADERMAL
  Filled 2019-05-19: qty 5

## 2019-05-19 NOTE — ED Triage Notes (Signed)
Pt comes via pOV with c/o lip laceration. Pt states she was playing with her dog and hit her lip on the bed. Pt has laceration noted above top lip. Bleeding controlled at this time.

## 2019-05-19 NOTE — Discharge Instructions (Signed)
Please have the sutures removed in 4-5 days.  Take tylenol for pain if needed.  Return to the ER for symptoms of concern if unable to schedule an appointment.

## 2019-05-19 NOTE — ED Provider Notes (Signed)
[facility] Emergency Department Provider Note  ____________________________________________  Time seen: Approximately 10:10 PM  I have reviewed the triage vital signs and the nursing notes.   HISTORY  Chief Complaint Laceration   HPI Amber Gibson is a 18 y.o. female who presents emergency department after mechanical, non-syncopal fall.  She states that she tripped and fell and hit her lip on the corner of the bed frame.  She has a laceration that extends through.  No injury to her teeth.  She denies other injuries. She is pregnant, but denies any complaints.  Past Medical History:  Diagnosis Date  . Menometrorrhagia   . Orthostatic proteinuria   . Vitamin D deficiency     Patient Active Problem List   Diagnosis Date Noted  . Supervision of normal first pregnancy, antepartum 03/04/2019  . Menometrorrhagia 07/14/2015    Past Surgical History:  Procedure Laterality Date  . NO PAST SURGERIES      Prior to Admission medications   Medication Sig Start Date End Date Taking? Authorizing Provider  doxylamine, Sleep, (UNISOM SLEEPTABS) 25 MG tablet Take 25 mg by mouth at bedtime as needed.    [provider]  naproxen (NAPROSYN) 500 MG tablet TAKE 1 TABLET (500 MG TOTAL) 2 (TWO) TIMES DAILY WITH A MEAL BY MOUTH. AS NEEDED FOR PAIN Patient not taking: Reported on 03/04/2019 07/21/18   Purcell Nails, CNM  promethazine (PHENERGAN) 25 MG tablet Take 1 tablet (25 mg total) by mouth every 6 (six) hours as needed for nausea or vomiting. 03/23/19   Oswaldo Conroy, CNM  vitamin B-6 (PYRIDOXINE) 25 MG tablet Take 25 mg by mouth daily.    [provider]  Vitamin D, Ergocalciferol, (DRISDOL) 1.25 MG (50000 UT) CAPS capsule Take 1 capsule (50,000 Units total) by mouth every 7 (seven) days. Patient not taking: Reported on 02/18/2019 09/16/18   Kerman Passey, MD    Allergies Patient has no known allergies.  Family History  Problem  Relation Age of Onset  . Lupus Maternal Grandmother   . Diabetes Maternal Grandmother   . Heart disease Maternal Grandmother   . Hypertension Maternal Grandmother   . COPD Maternal Grandmother   . Diabetes Maternal Grandfather   . Heart disease Maternal Grandfather   . Hypertension Maternal Grandfather   . Cancer Neg Hx   . Stroke Neg Hx     Social History Social History   Tobacco Use  . Smoking status: Never Smoker  . Smokeless tobacco: Never Used  Substance Use Topics  . Alcohol use: No    Alcohol/week: 0.0 standard drinks  . Drug use: No    Review of Systems  Constitutional: Negative for fever. Respiratory: Negative for cough or shortness of breath.  Musculoskeletal: Negative for myalgias Skin: Positive for laceration to the upper lip. Neurological: Negative for numbness or paresthesias. ____________________________________________   PHYSICAL EXAM:  VITAL SIGNS: ED Triage Vitals [05/19/19 2151]  Enc Vitals Group     BP 138/80     Pulse Rate (!) 102     Resp 18     Temp 98.2 F (36.8 C)     Temp src      SpO2 100 %     Weight 130 lb (59 kg)     Height 5\' 3"  (1.6 m)     Head Circumference      Peak Flow      Pain Score 2     Pain Loc  Pain Edu?      Excl. in GC?      Constitutional: Well appearing. Eyes: Conjunctivae are clear without discharge or drainage. Nose: No rhinorrhea noted. Mouth/Throat: Airway is patent.  Neck: No stridor. Unrestricted range of motion observed. Cardiovascular: Capillary refill is <3 seconds.  Respiratory: Respirations are even and unlabored.. Musculoskeletal: Unrestricted range of motion observed. Neurologic: Awake, alert, and oriented x 4.  Skin:  Through and through lip laceration with cross over the vermilion border.  ____________________________________________   LABS (all labs ordered are listed, but only abnormal results are displayed)  Labs Reviewed - No data to  display ____________________________________________  EKG  Not indicated. ____________________________________________  RADIOLOGY  Not indicated ____________________________________________   PROCEDURES  .Marland KitchenLaceration Repair  Date/Time: 05/19/2019 11:03 PM Performed by: Chinita Pester, FNP Authorized by: Chinita Pester, FNP   Consent:    Consent obtained:  Verbal   Consent given by:  Patient   Risks discussed:  Poor cosmetic result Anesthesia (see MAR for exact dosages):    Anesthesia method:  Topical application and local infiltration   Topical anesthetic:  LET   Local anesthetic:  Lidocaine 1% w/o epi Laceration details:    Location:  Lip   Lip location:  Upper lip, full thickness   Height of lip laceration:  More than half vertical height Repair type:    Repair type:  Intermediate Pre-procedure details:    Preparation:  Patient was prepped and draped in usual sterile fashion Exploration:    Wound exploration: wound explored through full range of motion     Contaminated: no   Treatment:    Area cleansed with:  Betadine and saline   Amount of cleaning:  Standard   Irrigation method:  Tap Subcutaneous repair:    Suture size:  6-0   Suture material:  Vicryl   Suture technique:  Simple interrupted   Number of sutures:  1 Skin repair:    Repair method:  Sutures   Suture size:  6-0   Suture material:  Prolene   Suture technique:  Simple interrupted   Number of sutures:  3 Approximation:    Approximation:  Close   Vermilion border: well-aligned   Post-procedure details:    Dressing:  Open (no dressing)   Patient tolerance of procedure:  Tolerated well, no immediate complications   ____________________________________________   INITIAL IMPRESSION / ASSESSMENT AND PLAN / ED COURSE  Amber Gibson is a 18 y.o. female who presents to the emergency department after mechanical, non-syncopal fall causing a full-thickness laceration to the upper lip.   See above for further details.   Patient was advised that she needs to have the sutures removed in 4 to 5 days.  She was advised to rinse her mouth with salt water solution after eating.  She was advised to use a soft diet until her lip has healed.  She was advised to return to the emergency department for symptoms of concern if unable to schedule an appointment.  Medications  lidocaine-EPINEPHrine-tetracaine (LET) solution (3 mLs Topical Given by Other 05/19/19 2250)  lidocaine (PF) (XYLOCAINE) 1 % injection 5 mL (5 mLs Intradermal Given by Other 05/19/19 2250)     Pertinent labs & imaging results that were available during my care of the patient were reviewed by me and considered in my medical decision making (see chart for details).  ____________________________________________   FINAL CLINICAL IMPRESSION(S) / ED DIAGNOSES  Final diagnoses:  Lip laceration, initial encounter    ED  Discharge Orders    None       Note:  This document was prepared using Dragon voice recognition software and may include unintentional dictation errors.   Victorino Dike, FNP 05/19/19 2307    Lavonia Drafts, MD 05/19/19 [phone]

## 2019-05-26 ENCOUNTER — Encounter: Payer: Self-pay | Admitting: Family Medicine

## 2019-05-26 ENCOUNTER — Other Ambulatory Visit: Payer: Self-pay

## 2019-05-26 ENCOUNTER — Ambulatory Visit (INDEPENDENT_AMBULATORY_CARE_PROVIDER_SITE_OTHER): Payer: 59 | Admitting: Family Medicine

## 2019-05-26 VITALS — BP 106/58 | HR 112 | Temp 97.3°F | Resp 14 | Ht 64.0 in | Wt 135.7 lb

## 2019-05-26 DIAGNOSIS — S01511D Laceration without foreign body of lip, subsequent encounter: Secondary | ICD-10-CM | POA: Diagnosis not present

## 2019-05-26 DIAGNOSIS — Z4802 Encounter for removal of sutures: Secondary | ICD-10-CM | POA: Diagnosis not present

## 2019-05-26 NOTE — Progress Notes (Signed)
Patient ID: Amber Gibson, female    DOB: 01-19-2001, 18 y.o.   MRN: [id number]  PCP: Kerman Passey, MD  Chief Complaint  Patient presents with  . Suture / Staple Removal    from fall on 9/22    Subjective:   Amber Gibson is a 18 y.o. female, presents to clinic with CC of the following:  Suture removal for laceration to left upper lip About 7 days ago patient fell into the corner of her bed cutting her upper lip she went to the ER and had laceration repair with absorbable sutures inside her mouth and to sutures to her upper left lip.  It is been healing well, inside her mouth is almost healed, she has had no bleeding, redness, swelling or purulent discharge.  She is having trouble rubbing her lips together but there is no visible swelling or asymmetry.  She is here for suture removal today. She is visibly nervous  Patient Active Problem List   Diagnosis Date Noted  . Supervision of normal first pregnancy, antepartum 03/04/2019  . Menometrorrhagia 07/14/2015      Current Outpatient Medications:  .  doxylamine, Sleep, (UNISOM SLEEPTABS) 25 MG tablet, Take 25 mg by mouth at bedtime as needed., Disp: , Rfl:  .  Prenat-Fe Carbonyl-FA-Omega 3 (ONE-A-DAY WOMENS PRENATAL 1 PO), Take by mouth., Disp: , Rfl:  .  promethazine (PHENERGAN) 25 MG tablet, Take 1 tablet (25 mg total) by mouth every 6 (six) hours as needed for nausea or vomiting., Disp: 30 tablet, Rfl: 1 .  naproxen (NAPROSYN) 500 MG tablet, TAKE 1 TABLET (500 MG TOTAL) 2 (TWO) TIMES DAILY WITH A MEAL BY MOUTH. AS NEEDED FOR PAIN (Patient not taking: Reported on 03/04/2019), Disp: 180 tablet, Rfl: 2 .  vitamin B-6 (PYRIDOXINE) 25 MG tablet, Take 25 mg by mouth daily., Disp: , Rfl:  .  Vitamin D, Ergocalciferol, (DRISDOL) 1.25 MG (50000 UT) CAPS capsule, Take 1 capsule (50,000 Units total) by mouth every 7 (seven) days. (Patient not taking: Reported on 02/18/2019), Disp: 4 capsule, Rfl: 1   No Known Allergies   Family History  Problem Relation Age of Onset  . Lupus Maternal Grandmother   . Diabetes Maternal Grandmother   . Heart disease Maternal Grandmother   . Hypertension Maternal Grandmother   . COPD Maternal Grandmother   . Diabetes Maternal Grandfather   . Heart disease Maternal Grandfather   . Hypertension Maternal Grandfather   . Cancer Neg Hx   . Stroke Neg Hx      Social History   Socioeconomic History  . Marital status: Single    Spouse name: Not on file  . Number of children: Not on file  . Years of education: Not on file  . Highest education level: Not on file  Occupational History  . Not on file  Social Needs  . Financial resource strain: Not on file  . Food insecurity    Worry: Not on file    Inability: Not on file  . Transportation needs    Medical: Not on file    Non-medical: Not on file  Tobacco Use  . Smoking status: Never Smoker  . Smokeless tobacco: Never Used  Substance and Sexual Activity  . Alcohol use: No    Alcohol/week: 0.0 standard drinks  . Drug use: No  . Sexual activity: Yes    Birth control/protection: None  Lifestyle  . Physical activity    Days per week: Not on file  Minutes per session: Not on file  . Stress: Not on file  Relationships  . Social Musician on phone: Not on file    Gets together: Not on file    Attends religious service: Not on file    Active member of club or organization: Not on file    Attends meetings of clubs or organizations: Not on file    Relationship status: Not on file  . Intimate partner violence    Fear of current or ex partner: Not on file    Emotionally abused: Not on file    Physically abused: Not on file    Forced sexual activity: Not on file  Other Topics Concern  . Not on file  Social History Narrative  . Not on file    I personally reviewed active problem list, medication list, allergies, notes from last encounter with the patient/caregiver today.  Review of Systems   Constitutional: Negative.   HENT: Negative.   Eyes: Negative.   Respiratory: Negative.   Cardiovascular: Negative.   Gastrointestinal: Negative.   Endocrine: Negative.   Genitourinary: Negative.   Musculoskeletal: Negative.   Skin: Negative.   Allergic/Immunologic: Negative.   Neurological: Negative.   Hematological: Negative.   Psychiatric/Behavioral: Negative.   All other systems reviewed and are negative.      Objective:   Vitals:   05/26/19 1516  BP: (!) 106/58  Pulse: (!) 112  Resp: 14  Temp: (!) 97.3 F (36.3 C)  SpO2: 98%  Weight: 135 lb 11.2 oz (61.6 kg)  Height: 5\' 4"  (1.626 m)    Body mass index is 23.29 kg/m.   Physical Exam Vitals signs and nursing note reviewed.  Constitutional:      Appearance: She is well-developed.  HENT:     Head: Normocephalic and atraumatic.     Nose: Nose normal.     Mouth/Throat:     Mouth: Mucous membranes are dry.     Dentition: Normal dentition.     Pharynx: Oropharynx is clear.      Comments: 2 simple interrupted sutures intact to left upper lip laceration.  Wound is clean dry and intact Internal upper left lip healing well with sutures absent, no redness ulceration or lesions to her gums  Eyes:     General:        Right eye: No discharge.        Left eye: No discharge.     Conjunctiva/sclera: Conjunctivae normal.  Neck:     Musculoskeletal: Full passive range of motion without pain and normal range of motion.     Trachea: No tracheal deviation.  Cardiovascular:     Rate and Rhythm: Normal rate and regular rhythm.  Pulmonary:     Effort: Pulmonary effort is normal. No respiratory distress.     Breath sounds: No stridor.  Musculoskeletal: Normal range of motion.  Skin:    General: Skin is warm and dry.     Findings: No rash.  Neurological:     Mental Status: She is alert.     Motor: No abnormal muscle tone.     Coordination: Coordination normal.  Psychiatric:        Behavior: Behavior normal.       Suture Removal  Date/Time: 05/26/2019 4:35 PM Performed by: Danelle Berry, PA-C Authorized by: Danelle Berry, PA-C  Body area: mouth Wound Appearance: clean Sutures Removed: 2 Post-removal: antibiotic ointment applied Patient tolerance: patient tolerated the procedure well with no immediate complications  Assessment & Plan:      ICD-10-CM   1. Lip laceration, subsequent encounter  S01.511D    injury and repair 7 days ago, ER note reviewed  2. Encounter for removal of sutures  Z48.02      reviewed wound care, handout given   Delsa Grana, PA-C 05/26/19 3:52 PM

## 2019-05-26 NOTE — Patient Instructions (Signed)
Gentle face washing, pat skin dry Apply vaseline (petroleum jelly) 1-2 x a day - helps healing  A month or two from now look at [facility] - or other scar minimizing meds   How to Minimize Scarring After Surgery Scarring is a risk of any surgery that involves cutting the skin (making an incision). You can reduce scarring by following instructions from your health care provider for care at home after surgery. This includes keeping your incision clean, moist, and protected from the sun. What are the risks?  Every person scars differently. Factors that affect how you scar include: ? Which surgery technique was used. ? Where the incision was made on your body. ? Your overall health. ? Your age. ? Your skin. ? Some medicines. How to minimize scarring after surgery Right after surgery Follow instructions from your health care provider about how to take care of your incision. Make sure you:  Wash your hands with soap and water before you change your bandage (dressing). If soap and water are not available, use hand sanitizer.  Change your dressing as told by your health care provider.  Leave stitches (sutures), skin glue, or adhesive strips in place. These skin closures may need to be in place for 2 weeks or longer. If adhesive strip edges start to loosen and curl up, you may trim the loose edges. Do not remove adhesive strips completely unless your health care provider tells you to do that.  Keep your incision clean by gently washing it with soap and water as told by your health care provider. This will help to prevent infection.  If directed, apply antibiotic ointment or another ointment, such as petroleum jelly, to the incision to keep it moist until it heals fully. You may need to moisten two times per day for about 2 weeks.  Get sutures taken out at the scheduled time.  Avoid touching or manipulating your incision unless needed. Wash your hands thoroughly before and after you touch your  incision.  After your skin has healed  Keep your scar protected from the sun. Cover the scar with sunscreen that has an SPF (sun protection factor) of 30 or higher. Do not put sunscreen on your scar until it has healed.  Gently massage the scar using a circular motion. This will help to minimize the appearance of the scar. Do this only after the incision has closed and all the sutures have been removed.  Remember that the scar may appear lighter or darker than your normal skin color. This difference in color should even out with time.  If your scar does not fade or go away with time and you do not like how it looks, consider talking with a plastic surgeon or a dermatologist. Follow these instructions at home:  Do not use any products that contain nicotine or tobacco, such as cigarettes and e-cigarettes. If you need help quitting, ask your health care provider.  Follow all restrictions, such as limits on exercise or work. What you should do and should not do will depend on where your incision is located.  Keep all follow-up visits as told by your health care provider. This is important. Contact a health care provider if:   Your sutures come out or adhesive strips come off before your health care provider said they would.  You have redness, swelling, or pain around your incision.  You have fluid or blood coming from your incision.  Your incision feels warm to the touch.  You have pus  or a bad smell coming from your incision.  You have a fever.  You think that you are having a reaction to the antibiotic ointment. Get help right away if:  You have bleeding from the incision that does not stop. Summary  Scarring is a risk of any surgery that involves cutting the skin (making an incision).  Follow instructions from your health care provider about how to take care of your incision to help it heal and to minimize scarring.  Keep your incision clean. If directed, apply antibiotic  ointment or another ointment, such as petroleum jelly, to the incision.  Contact your health care provider if you have more redness, swelling, or pain around your incision, your incision feels warm to the touch, or you have pus or a bad smell coming from your incision.  Keep all follow-up visits as told by your health care provider. This is important. This information is not intended to replace advice given to you by your health care provider. Make sure you discuss any questions you have with your health care provider. Document Released: 01/31/2010 Document Revised: 12/20/2017 Document Reviewed: 12/20/2017 Elsevier Patient Education  2020 [employer].

## 2019-07-27 ENCOUNTER — Encounter: Payer: Self-pay | Admitting: Family Medicine

## 2019-07-28 ENCOUNTER — Encounter: Payer: Self-pay | Admitting: Family Medicine

## 2019-08-28 NOTE — L&D Delivery Note (Addendum)
Delivery Note   Patient Name: Amber Gibson DOB: Jan 27, 2001 MRN: [id number]  Date of admission: 09/09/2019 Delivering MD: Dale Ramblewood  Date of delivery: 09/09/19 Type of delivery: SVD  Newborn Data: Live born female  Birth Weight:   APGAR: 6, 7  Newborn Delivery   Birth date/time: 09/09/2019 21:12:00 Delivery type: Vaginal, Spontaneous    Amber Gibson, 18 y.o., @ [redacted]w[redacted]d,  G1P0000, who was admitted for PTL. I was called to the room when she progressed +2 station in the second stage of labor.  She pushed for 15/min.  She delivered a viable infant, cephalic and restituted to the ROA position over an intact perineum.  A nuchal cord   was not identified. The baby was placed on maternal abdomen while initial step of NRP were perfmored (Dry, Stimulated, and warmed). Hat placed on baby for thermoregulation. Delayed cord clamping was performed for 30 seconds.  Cord double clamped and cut.  Cord cut by father, then infant brought to radient warmer and assessed by NICU. Apgar scores were 6 and 7 given by NICU and declined needing cord gasses. Prophylactic Pitocin was started in the third stage of labor for active management. The placenta delivered spontaneously, dirty duncan, with a 3 vessel cord and was sent to pathology for PTL and circumvalleta.  Inspection revealed labial. An examination of the vaginal vault and cervix was free from lacerations. Slight edematous cervix was noted at the hymenal ring and was free from lacerations. The uterus was firm, bleeding stable.  Umbilical artery blood gas were not sent.  There were no complications during the procedure.  Mom and baby skin to skin following delivery. Left in stable condition. Newborn was cleared by the NICU  Maternal Info: Anesthesia:Epidural Episiotomy: No Lacerations:  left labial tear, hemostatic, not repaired Suture Repair: none Est. Blood Loss (mL):   Newborn Info:  Baby Sex: female  APGAR (1 MIN):   6 APGAR (5 MINS): 7  APGAR (10 MINS):     Mom to postpartum.  Baby to Couplet care / Skin to Skin.  Dr Charlotta Newton aware of birth   [place], [place], NP-C 09/09/19 9:41 PM

## 2019-09-02 ENCOUNTER — Encounter: Payer: Self-pay | Admitting: Family Medicine

## 2019-09-07 DIAGNOSIS — D563 Thalassemia minor: Secondary | ICD-10-CM | POA: Insufficient documentation

## 2019-09-07 DIAGNOSIS — D569 Thalassemia, unspecified: Secondary | ICD-10-CM | POA: Insufficient documentation

## 2019-09-08 ENCOUNTER — Encounter (HOSPITAL_COMMUNITY): Payer: Self-pay | Admitting: Obstetrics and Gynecology

## 2019-09-08 ENCOUNTER — Other Ambulatory Visit: Payer: Self-pay

## 2019-09-08 ENCOUNTER — Inpatient Hospital Stay (HOSPITAL_COMMUNITY)
Admission: AD | Admit: 2019-09-08 | Discharge: 2019-09-08 | Disposition: A | Discharge status: Home / Self Care | Payer: BC Managed Care – PPO | Source: Home / Self Care | Attending: Obstetrics and Gynecology | Admitting: Obstetrics and Gynecology

## 2019-09-08 DIAGNOSIS — Z3400 Encounter for supervision of normal first pregnancy, unspecified trimester: Secondary | ICD-10-CM

## 2019-09-08 DIAGNOSIS — K5900 Constipation, unspecified: Secondary | ICD-10-CM | POA: Insufficient documentation

## 2019-09-08 DIAGNOSIS — O26893 Other specified pregnancy related conditions, third trimester: Secondary | ICD-10-CM | POA: Insufficient documentation

## 2019-09-08 DIAGNOSIS — O4703 False labor before 37 completed weeks of gestation, third trimester: Secondary | ICD-10-CM | POA: Insufficient documentation

## 2019-09-08 DIAGNOSIS — Z3A35 35 weeks gestation of pregnancy: Secondary | ICD-10-CM | POA: Insufficient documentation

## 2019-09-08 DIAGNOSIS — O479 False labor, unspecified: Secondary | ICD-10-CM

## 2019-09-08 LAB — URINALYSIS, ROUTINE W REFLEX MICROSCOPIC
Bilirubin Urine: NEGATIVE
Glucose, UA: NEGATIVE mg/dL
Ketones, ur: NEGATIVE mg/dL
Leukocytes,Ua: NEGATIVE
Nitrite: NEGATIVE
Protein, ur: NEGATIVE mg/dL
Specific Gravity, Urine: 1.019 (ref 1.005–1.030)
pH: 7 (ref 5.0–8.0)

## 2019-09-08 LAB — WET PREP, GENITAL
Clue Cells Wet Prep HPF POC: NONE SEEN
Sperm: NONE SEEN
Trich, Wet Prep: NONE SEEN
Yeast Wet Prep HPF POC: NONE SEEN

## 2019-09-08 NOTE — MAU Note (Signed)
.   Amber Gibson is a 19 y.o. at [redacted]w[redacted]d here in MAU reporting: patient called [facility] and was sent over for labor eval. Her ctx were 10-15 minutes apart this morning but now they are 30 minutes apart.   Pain score: 2 Vitals:   09/08/19 1531  BP: 124/62  Pulse: 97  Resp: 17  Temp: 98.3 F (36.8 C)  SpO2: 99%     FHT:147

## 2019-09-08 NOTE — Discharge Instructions (Signed)
Braxton Hicks Contractions Contractions of the uterus can occur throughout pregnancy, but they are not always a sign that you are in labor. You may have practice contractions called Braxton Hicks contractions. These false labor contractions are sometimes confused with true labor. What are Braxton Hicks contractions? Braxton Hicks contractions are tightening movements that occur in the muscles of the uterus before labor. Unlike true labor contractions, these contractions do not result in opening (dilation) and thinning of the cervix. Toward the end of pregnancy (32-34 weeks), Braxton Hicks contractions can happen more often and may become stronger. These contractions are sometimes difficult to tell apart from true labor because they can be very uncomfortable. You should not feel embarrassed if you go to the hospital with false labor. Sometimes, the only way to tell if you are in true labor is for your health care provider to look for changes in the cervix. The health care provider will do a physical exam and may monitor your contractions. If you are not in true labor, the exam should show that your cervix is not dilating and your water has not broken. If there are no other health problems associated with your pregnancy, it is completely safe for you to be sent home with false labor. You may continue to have Braxton Hicks contractions until you go into true labor. How to tell the difference between true labor and false labor True labor  Contractions last 30-70 seconds.  Contractions become very regular.  Discomfort is usually felt in the top of the uterus, and it spreads to the lower abdomen and low back.  Contractions do not go away with walking.  Contractions usually become more intense and increase in frequency.  The cervix dilates and gets thinner. False labor  Contractions are usually shorter and not as strong as true labor contractions.  Contractions are usually irregular.  Contractions  are often felt in the front of the lower abdomen and in the groin.  Contractions may go away when you walk around or change positions while lying down.  Contractions get weaker and are shorter-lasting as time goes on.  The cervix usually does not dilate or become thin. Follow these instructions at home:   Take over-the-counter and prescription medicines only as told by your health care provider.  Keep up with your usual exercises and follow other instructions from your health care provider.  Eat and drink lightly if you think you are going into labor.  If Braxton Hicks contractions are making you uncomfortable: ? Change your position from lying down or resting to walking, or change from walking to resting. ? Sit and rest in a tub of warm water. ? Drink enough fluid to keep your urine pale yellow. Dehydration may cause these contractions. ? Do slow and deep breathing several times an hour.  Keep all follow-up prenatal visits as told by your health care provider. This is important. Contact a health care provider if:  You have a fever.  You have continuous pain in your abdomen. Get help right away if:  Your contractions become stronger, more regular, and closer together.  You have fluid leaking or gushing from your vagina.  You pass blood-tinged mucus (bloody show).  You have bleeding from your vagina.  You have low back pain that you never had before.  You feel your baby's head pushing down and causing pelvic pressure.  Your baby is not moving inside you as much as it used to. Summary  Contractions that occur before labor are   called Braxton Hicks contractions, false labor, or practice contractions.  Braxton Hicks contractions are usually shorter, weaker, farther apart, and less regular than true labor contractions. True labor contractions usually become progressively stronger and regular, and they become more frequent.  Manage discomfort from [facility] contractions  by changing position, resting in a warm bath, drinking plenty of water, or practicing deep breathing. This information is not intended to replace advice given to you by your health care provider. Make sure you discuss any questions you have with your health care provider. Document Revised: 07/26/2017 Document Reviewed: 12/27/2016 Elsevier Patient Education  2020 [employer].   Constipation, Adult Constipation is when a person:  Poops (has a bowel movement) fewer times in a week than normal.  Has a hard time pooping.  Has poop that is dry, hard, or bigger than normal. Follow these instructions at home: Eating and drinking   Eat foods that have a lot of fiber, such as: ? Fresh fruits and vegetables. ? Whole grains. ? Beans.  Eat less of foods that are high in fat, low in fiber, or overly processed, such as: ? [place] fries. ? Hamburgers. ? Cookies. ? Candy. ? Soda.  Drink enough fluid to keep your pee (urine) clear or pale yellow. General instructions  Exercise regularly or as told by your doctor.  Go to the restroom when you feel like you need to poop. Do not hold it in.  Take over-the-counter and prescription medicines only as told by your doctor. These include any fiber supplements.  Do pelvic floor retraining exercises, such as: ? Doing deep breathing while relaxing your lower belly (abdomen). ? Relaxing your pelvic floor while pooping.  Watch your condition for any changes.  Keep all follow-up visits as told by your doctor. This is important. Contact a doctor if:  You have pain that gets worse.  You have a fever.  You have not pooped for 4 days.  You throw up (vomit).  You are not hungry.  You lose weight.  You are bleeding from the anus.  You have thin, pencil-like poop (stool). Get help right away if:  You have a fever, and your symptoms suddenly get worse.  You leak poop or have blood in your poop.  Your belly feels hard or bigger than  normal (is bloated).  You have very bad belly pain.  You feel dizzy or you faint. This information is not intended to replace advice given to you by your health care provider. Make sure you discuss any questions you have with your health care provider. Document Revised: 07/26/2017 Document Reviewed: 02/01/2016 Elsevier Patient Education  2020 [employer].

## 2019-09-08 NOTE — MAU Provider Note (Addendum)
Chief Complaint:  Contractions   None    HPI: Amber Gibson is a 19 y.o. G1P0000 at [redacted]w[redacted]d who presents to maternity admissions reporting cxt that started this morning around 3am, then went awake, then came back and went away again, pt denies taking anything to make it better, unaware if anything m,akes it worse, pt also stated she noted a spot of brown discharge after that, she called [facility] who referred her to MAU. Pt endorses last sexual intercourse was thursday, and endorses pushing hard on the toilet this morning for a BM, she feels constipated. Pt does endorses drinking x3 16oz of water all day, but also vomited this monring. Denies trauma, vaginal itching, dysuria, SOB or cp. .  Location: lower abdomen  Quality: crampy Severity: 5/10 in pain scale Duration: stopped now, but occurred intermittently for several ours.  Timing: intermittent  Modifying factors: none Associated signs and symptoms: none  Denies contractions, leakage of fluid or vaginal bleeding. Good fetal movement.   Pregnancy Course:   Past Medical History:  Diagnosis Date  . Menometrorrhagia   . Orthostatic proteinuria   . Vitamin D deficiency    OB History  Gravida Para Term Preterm AB Living  1 0 0 0 0 0  SAB TAB Ectopic Multiple Live Births  0 0 0 0      # Outcome Date GA Lbr Len/2nd Weight Sex Delivery Anes PTL Lv  1 Current            Past Surgical History:  Procedure Laterality Date  . NO PAST SURGERIES     Family History  Problem Relation Age of Onset  . Lupus Maternal Grandmother   . Diabetes Maternal Grandmother   . Heart disease Maternal Grandmother   . Hypertension Maternal Grandmother   . COPD Maternal Grandmother   . Diabetes Maternal Grandfather   . Heart disease Maternal Grandfather   . Hypertension Maternal Grandfather   . Cancer Neg Hx   . Stroke Neg Hx    Social History   Tobacco Use  . Smoking status: Never Smoker  . Smokeless tobacco: Never Used  Substance Use Topics   . Alcohol use: No    Alcohol/week: 0.0 standard drinks  . Drug use: No   No Known Allergies Medications Prior to Admission  Medication Sig Dispense Refill Last Dose  . doxylamine, Sleep, (UNISOM SLEEPTABS) 25 MG tablet Take 25 mg by mouth at bedtime as needed.   09/07/2019 at Unknown time  . ondansetron (ZOFRAN) 4 MG tablet Take 4 mg by mouth every 8 (eight) hours as needed for nausea or vomiting.   09/08/2019 at Unknown time  . Prenat-Fe Carbonyl-FA-Omega 3 (ONE-A-DAY WOMENS PRENATAL 1 PO) Take by mouth.   Past Week at Unknown time  . naproxen (NAPROSYN) 500 MG tablet TAKE 1 TABLET (500 MG TOTAL) 2 (TWO) TIMES DAILY WITH A MEAL BY MOUTH. AS NEEDED FOR PAIN (Patient not taking: Reported on 03/04/2019) 180 tablet 2   . promethazine (PHENERGAN) 25 MG tablet Take 1 tablet (25 mg total) by mouth every 6 (six) hours as needed for nausea or vomiting. 30 tablet 1   . vitamin B-6 (PYRIDOXINE) 25 MG tablet Take 25 mg by mouth daily.     . Vitamin D, Ergocalciferol, (DRISDOL) 1.25 MG (50000 UT) CAPS capsule Take 1 capsule (50,000 Units total) by mouth every 7 (seven) days. (Patient not taking: Reported on 02/18/2019) 4 capsule 1     I have reviewed patient's Past Medical Hx, Surgical  Hx, Family Hx, Social Hx, medications and allergies.   ROS:  Review of Systems  Constitutional: Negative.   HENT: Negative.   Eyes: Negative.   Respiratory: Negative.   Cardiovascular: Negative.   Gastrointestinal: Positive for abdominal pain.  Musculoskeletal: Positive for back pain.  Neurological: Negative.   Hematological: Negative.   Psychiatric/Behavioral: Negative.     Physical Exam   Patient Vitals for the past 24 hrs:  BP Temp Temp src Pulse Resp SpO2 Height Weight  09/08/19 1531 124/62 98.3 F (36.8 C) Oral 97 17 99 % -- --  09/08/19 1522 -- -- -- -- -- -- 5\' 3"  (1.6 m) 70 kg   Constitutional: Well-developed, well-nourished female in no acute distress.  Cardiovascular: normal rate Respiratory:  normal effort GI: Abd soft, non-tender, gravid appropriate for gestational age. Pos BS x 4 MS: Extremities nontender, no edema, normal ROM Neurologic: Alert and oriented x 4.  GU: Neg CVAT.  Pelvic: NEFG, physiologic discharge, scant amount of brown discharged noted in the vaginal vault, stool felt through the vaginal vault as well, cervix clean. Pelvic adequate for labor. No CMT  Dilation: 1.5 Effacement (%): 80 Cervical Position: Anterior Station: -2 Exam by:: [facility] FNP  No change after 2 hours  NST: FHR baseline 140 bpm, Variability: moderate, Accelerations:present, Decelerations:  Absent= Cat 1/Reactive UC:   irregular, uterine irritability, cxt that were showing up on toco were not palpable, and cxt that were palpable felt like [place], uterus only tightened on the side not the fundus.  SVE:   Dilation: 1.5 Effacement (%): 80 Station: -2 Exam by:: [facility] FNP, vertex verified by fetal sutures.  Leopold's: Position vertex, EFW 7.5lbs via leopold's.   Labs: Results for orders placed or performed during the hospital encounter of 09/08/19 (from the past 24 hour(s))  Urinalysis, Routine w reflex microscopic     Status: Abnormal   Collection Time: 09/08/19  3:43 PM  Result Value Ref Range   Color, Urine YELLOW YELLOW   APPearance CLEAR CLEAR   Specific Gravity, Urine 1.019 1.005 - 1.030   pH 7.0 5.0 - 8.0   Glucose, UA NEGATIVE NEGATIVE mg/dL   Hgb urine dipstick SMALL (A) NEGATIVE   Bilirubin Urine NEGATIVE NEGATIVE   Ketones, ur NEGATIVE NEGATIVE mg/dL   Protein, ur NEGATIVE NEGATIVE mg/dL   Nitrite NEGATIVE NEGATIVE   Leukocytes,Ua NEGATIVE NEGATIVE   RBC / HPF 6-10 0 - 5 RBC/hpf   WBC, UA 0-5 0 - 5 WBC/hpf   Bacteria, UA RARE (A) NONE SEEN   Squamous Epithelial / LPF 6-10 0 - 5   Mucus PRESENT   Wet prep, genital     Status: Abnormal   Collection Time: 09/08/19  4:00 PM  Result Value Ref Range   Yeast Wet Prep HPF POC NONE SEEN NONE SEEN   Trich, Wet Prep  NONE SEEN NONE SEEN   Clue Cells Wet Prep HPF POC NONE SEEN NONE SEEN   WBC, Wet Prep HPF POC MANY (A) NONE SEEN   Sperm NONE SEEN     Imaging:  No results found.  MAU Course: Orders Placed This Encounter  Procedures  . Wet prep, genital  . OB Urine Culture  . Culture, beta strep (group b only)  . Urinalysis, Routine w reflex microscopic  . Diet - low sodium heart healthy  . Increase activity slowly  . Call MD for:  . Call MD for:  temperature >100.4  . Call MD for:  persistant nausea and vomiting  .  Call MD for:  severe uncontrolled pain  . Call MD for:  redness, tenderness, or signs of infection (pain, swelling, redness, odor or green/yellow discharge around incision site)  . Call MD for:  difficulty breathing, headache or visual disturbances  . Call MD for:  hives  . Call MD for:  persistant dizziness or light-headedness  . Call MD for:  extreme fatigue  . Discharge patient Discharge disposition: 01-Home or Self Care; Discharge patient date: 09/08/2019   No orders of the defined types were placed in this encounter.   MDM: Bucket of water, PE, cervical check, stable NST, recheck cervic, no changed discharge to home in stable condition. Consulted with Dr Su Hilt  Assessment: 1. False labor   2. Supervision of normal first pregnancy, antepartum   3. Constipation, unspecified constipation type     Plan: Discharge home in stable condition.  Labor precautions and fetal kick counts Pending GC/C/urine culture, and GBS culture.  LABOR: When contractions begin, you should start to time them from the beginning of one contraction to the beginning of the next.  When contractions are 5-10 minutes apart or less and have been regular for at least an hour, you should call your health care provider.  Notify your doctor if any of the following occur: 1. Bleeding from the vagina 7. Sudden, constant, or occasional abdominal pain  2. Pain or burning when urinating 8. Sudden gushing of  fluid from the vagina (with or without continued leaking)  3. Chills or fever 9. Fainting spells, "black outs" or loss of consciousness  4. Increase in vaginal discharge 10. Severe or continued nausea or vomiting  5. Pelvic pressure (sudden increase) 11. Blurring of vision or spots before the eyes  6. Baby moving less than usual 12. Leaking of fluid    FETAL KICK COUNT: Lie on your left side for one hour after a meal, and count the number of times your baby kicks. If it is less than 5 times, get up, move around and drink some juice. Repeat the test 30 minutes later. If it is still less than 5 kicks in an hour, notify your doctor.  PRETERM LABOR: Includes any of the following symptoms that occur between 20-[redacted] weeks gestation. If these symptoms are not stopped, preterm labor can result in preterm delivery, placing your baby at risk.  Notify your doctor if any of the following occur: 1. Menstrual-like cramps   5. Pelvic pressure  2. Uterine contractions. These may be painless and feel like the uterus is tightening or the baby is "balling up" 6. Increase or change in vaginal discharge  3. Low, dull backache, unrelieved by heat or Tylenol  7. Vaginal bleeding  4. Intestinal cramps, with our without diarrhea, sometimes 8. A general feeling that "something is not right"   9. Leaking of fluid described as "gas pain"   Follow-up Information    [facility] Follow up.   Specialty: Obstetrics and Gynecology Why: ROB tomorrow.  Contact information: [address] [place] [address] [phone]          Allergies as of 09/08/2019   No Known Allergies     Medication List    STOP taking these medications   naproxen 500 MG tablet Commonly known as: NAPROSYN     TAKE these medications   ondansetron 4 MG tablet Commonly known as: ZOFRAN Take 4 mg by mouth every 8 (eight) hours as needed for nausea or vomiting.   ONE-A-DAY  WOMENS PRENATAL 1 PO Take by mouth.   promethazine 25 MG tablet Commonly known as: PHENERGAN Take 1 tablet (25 mg total) by mouth every 6 (six) hours as needed for nausea or vomiting.   Unisom SleepTabs 25 MG tablet Generic drug: doxylamine (Sleep) Take 25 mg by mouth at bedtime as needed.   vitamin B-6 25 MG tablet Commonly known as: pyridOXINE Take 25 mg by mouth daily.   Vitamin D (Ergocalciferol) 1.25 MG (50000 UNIT) Caps capsule Commonly known as: DRISDOL Take 1 capsule (50,000 Units total) by mouth every 7 (seven) days.       [facility] NP-C, CNM [place], [place] 09/08/2019 5:44 PM

## 2019-09-09 ENCOUNTER — Inpatient Hospital Stay (HOSPITAL_COMMUNITY): Payer: BC Managed Care – PPO | Admitting: Anesthesiology

## 2019-09-09 ENCOUNTER — Inpatient Hospital Stay (HOSPITAL_COMMUNITY)
Admission: AD | Admit: 2019-09-09 | Discharge: 2019-09-12 | DRG: 807 | Disposition: A | Discharge status: Home / Self Care | Payer: BC Managed Care – PPO | Attending: Obstetrics and Gynecology | Admitting: Obstetrics and Gynecology

## 2019-09-09 ENCOUNTER — Other Ambulatory Visit: Payer: Self-pay

## 2019-09-09 ENCOUNTER — Encounter (HOSPITAL_COMMUNITY): Payer: Self-pay | Admitting: Obstetrics and Gynecology

## 2019-09-09 DIAGNOSIS — Z3A35 35 weeks gestation of pregnancy: Secondary | ICD-10-CM | POA: Diagnosis not present

## 2019-09-09 DIAGNOSIS — Z20822 Contact with and (suspected) exposure to covid-19: Secondary | ICD-10-CM | POA: Diagnosis present

## 2019-09-09 DIAGNOSIS — O99824 Streptococcus B carrier state complicating childbirth: Secondary | ICD-10-CM | POA: Diagnosis present

## 2019-09-09 DIAGNOSIS — K5900 Constipation, unspecified: Secondary | ICD-10-CM | POA: Diagnosis present

## 2019-09-09 DIAGNOSIS — O26893 Other specified pregnancy related conditions, third trimester: Secondary | ICD-10-CM | POA: Diagnosis present

## 2019-09-09 LAB — ABO/RH: ABO/RH(D): O POS

## 2019-09-09 LAB — URINALYSIS, ROUTINE W REFLEX MICROSCOPIC
Bilirubin Urine: NEGATIVE
Glucose, UA: 50 mg/dL — AB
Hgb urine dipstick: NEGATIVE
Ketones, ur: 80 mg/dL — AB
Nitrite: NEGATIVE
Protein, ur: 100 mg/dL — AB
Specific Gravity, Urine: 1.035 — ABNORMAL HIGH (ref 1.005–1.030)
pH: 5 (ref 5.0–8.0)

## 2019-09-09 LAB — CBC
HCT: 37.5 % (ref 36.0–46.0)
Hemoglobin: 12 g/dL (ref 12.0–15.0)
MCH: 27.3 pg (ref 26.0–34.0)
MCHC: 32 g/dL (ref 30.0–36.0)
MCV: 85.4 fL (ref 80.0–100.0)
Platelets: 220 10*3/uL (ref 150–400)
RBC: 4.39 MIL/uL (ref 3.87–5.11)
RDW: 13.1 % (ref 11.5–15.5)
WBC: 12.8 10*3/uL — ABNORMAL HIGH (ref 4.0–10.5)
nRBC: 0 % (ref 0.0–0.2)

## 2019-09-09 LAB — CULTURE, OB URINE: Culture: <10000 — AB

## 2019-09-09 LAB — GROUP B STREP BY PCR: Group B strep by PCR: POSITIVE — AB

## 2019-09-09 LAB — GC/CHLAMYDIA PROBE AMP (~~LOC~~) NOT AT ARMC
Chlamydia: NEGATIVE
Comment: NEGATIVE
Comment: NORMAL
Neisseria Gonorrhea: NEGATIVE

## 2019-09-09 LAB — RESPIRATORY PANEL BY RT PCR (FLU A&B, COVID)
Influenza A by PCR: NEGATIVE
Influenza B by PCR: NEGATIVE
SARS Coronavirus 2 by RT PCR: NEGATIVE

## 2019-09-09 LAB — TYPE AND SCREEN
ABO/RH(D): O POS
Antibody Screen: NEGATIVE

## 2019-09-09 MED ORDER — ONDANSETRON 4 MG PO TBDP
4.0000 mg | ORAL_TABLET | Freq: Four times a day (QID) | ORAL | Status: DC | PRN
  Administered 2019-09-09: 08:00:00 4 mg via ORAL
  Filled 2019-09-09: qty 1

## 2019-09-09 MED ORDER — PHENYLEPHRINE 40 MCG/ML (10ML) SYRINGE FOR IV PUSH (FOR BLOOD PRESSURE SUPPORT)
80.0000 ug | PREFILLED_SYRINGE | INTRAVENOUS | Status: DC | PRN
  Filled 2019-09-09: qty 10

## 2019-09-09 MED ORDER — PHENYLEPHRINE 40 MCG/ML (10ML) SYRINGE FOR IV PUSH (FOR BLOOD PRESSURE SUPPORT): 80.0000 ug | PREFILLED_SYRINGE | INTRAVENOUS | Status: DC | PRN

## 2019-09-09 MED ORDER — OXYTOCIN 40 UNITS IN NORMAL SALINE INFUSION - SIMPLE MED
2.5000 [IU]/h | INTRAVENOUS | Status: DC
  Filled 2019-09-09: qty 1000

## 2019-09-09 MED ORDER — OXYCODONE-ACETAMINOPHEN 5-325 MG PO TABS
1.0000 | ORAL_TABLET | Freq: Once | ORAL | Status: AC
  Administered 2019-09-09: 07:00:00 1 via ORAL
  Filled 2019-09-09: qty 1

## 2019-09-09 MED ORDER — OXYTOCIN 40 UNITS IN NORMAL SALINE INFUSION - SIMPLE MED
1.0000 m[IU]/min | INTRAVENOUS | Status: DC
  Administered 2019-09-09: 20:00:00 2 m[IU]/min via INTRAVENOUS

## 2019-09-09 MED ORDER — LACTATED RINGERS IV SOLN: Freq: Once | INTRAVENOUS | Status: AC

## 2019-09-09 MED ORDER — TETANUS-DIPHTH-ACELL PERTUSSIS 5-2.5-18.5 LF-MCG/0.5 IM SUSP: 0.5000 mL | Freq: Once | INTRAMUSCULAR | Status: DC

## 2019-09-09 MED ORDER — ONDANSETRON HCL 4 MG PO TABS: 4.0000 mg | ORAL_TABLET | ORAL | Status: DC | PRN

## 2019-09-09 MED ORDER — OXYTOCIN BOLUS FROM INFUSION
500.0000 mL | Freq: Once | INTRAVENOUS | Status: AC
  Administered 2019-09-09: 21:00:00 500 mL via INTRAVENOUS

## 2019-09-09 MED ORDER — BETAMETHASONE SOD PHOS & ACET 6 (3-3) MG/ML IJ SUSP
12.0000 mg | Freq: Once | INTRAMUSCULAR | Status: AC
  Administered 2019-09-09: 09:00:00 12 mg via INTRAMUSCULAR
  Filled 2019-09-09: qty 5

## 2019-09-09 MED ORDER — OXYCODONE-ACETAMINOPHEN 5-325 MG PO TABS: 2.0000 | ORAL_TABLET | ORAL | Status: DC | PRN

## 2019-09-09 MED ORDER — PRENATAL MULTIVITAMIN CH
1.0000 | ORAL_TABLET | Freq: Every day | ORAL | Status: DC
  Administered 2019-09-10 – 2019-09-11 (×2): 1 via ORAL
  Filled 2019-09-09 (×2): qty 1

## 2019-09-09 MED ORDER — FLEET ENEMA 7-19 GM/118ML RE ENEM: 1.0000 | ENEMA | RECTAL | Status: DC | PRN

## 2019-09-09 MED ORDER — FENTANYL CITRATE (PF) 100 MCG/2ML IJ SOLN
100.0000 ug | INTRAMUSCULAR | Status: DC | PRN
  Administered 2019-09-09 (×2): 100 ug via INTRAVENOUS
  Filled 2019-09-09 (×2): qty 2

## 2019-09-09 MED ORDER — BENZOCAINE-MENTHOL 20-0.5 % EX AERO
1.0000 "application " | INHALATION_SPRAY | CUTANEOUS | Status: DC | PRN
  Administered 2019-09-10: 1 via TOPICAL
  Filled 2019-09-09: qty 56

## 2019-09-09 MED ORDER — OXYCODONE-ACETAMINOPHEN 5-325 MG PO TABS
1.0000 | ORAL_TABLET | Freq: Once | ORAL | Status: AC
  Administered 2019-09-09: 1 via ORAL
  Filled 2019-09-09: qty 1

## 2019-09-09 MED ORDER — ONDANSETRON HCL 4 MG/2ML IJ SOLN
4.0000 mg | Freq: Four times a day (QID) | INTRAMUSCULAR | Status: DC | PRN
  Administered 2019-09-09: 14:00:00 4 mg via INTRAVENOUS
  Filled 2019-09-09: qty 2

## 2019-09-09 MED ORDER — DIPHENHYDRAMINE HCL 25 MG PO CAPS: 25.0000 mg | ORAL_CAPSULE | Freq: Four times a day (QID) | ORAL | Status: DC | PRN

## 2019-09-09 MED ORDER — ACETAMINOPHEN 325 MG PO TABS: 650.0000 mg | ORAL_TABLET | ORAL | Status: DC | PRN

## 2019-09-09 MED ORDER — ACETAMINOPHEN 325 MG PO TABS
650.0000 mg | ORAL_TABLET | ORAL | Status: DC | PRN
  Administered 2019-09-10 – 2019-09-11 (×3): 650 mg via ORAL
  Filled 2019-09-09 (×3): qty 2

## 2019-09-09 MED ORDER — LACTATED RINGERS IV SOLN: INTRAVENOUS | Status: DC

## 2019-09-09 MED ORDER — LIDOCAINE HCL (PF) 1 % IJ SOLN
30.0000 mL | INTRAMUSCULAR | Status: AC | PRN
  Administered 2019-09-09 (×2): 5 mL via SUBCUTANEOUS

## 2019-09-09 MED ORDER — DIPHENHYDRAMINE HCL 50 MG/ML IJ SOLN: 12.5000 mg | INTRAMUSCULAR | Status: DC | PRN

## 2019-09-09 MED ORDER — PENICILLIN G POT IN DEXTROSE 60000 UNIT/ML IV SOLN
3.0000 10*6.[IU] | INTRAVENOUS | Status: DC
  Administered 2019-09-09 (×2): 3 10*6.[IU] via INTRAVENOUS
  Filled 2019-09-09 (×3): qty 50

## 2019-09-09 MED ORDER — ONDANSETRON HCL 4 MG/2ML IJ SOLN: 4.0000 mg | INTRAMUSCULAR | Status: DC | PRN

## 2019-09-09 MED ORDER — WITCH HAZEL-GLYCERIN EX PADS: 1.0000 "application " | MEDICATED_PAD | CUTANEOUS | Status: DC | PRN

## 2019-09-09 MED ORDER — DIBUCAINE (PERIANAL) 1 % EX OINT: 1.0000 "application " | TOPICAL_OINTMENT | CUTANEOUS | Status: DC | PRN

## 2019-09-09 MED ORDER — SODIUM CHLORIDE (PF) 0.9 % IJ SOLN
INTRAMUSCULAR | Status: DC | PRN
  Administered 2019-09-09: 12 mL/h via EPIDURAL

## 2019-09-09 MED ORDER — COCONUT OIL OIL: 1.0000 "application " | TOPICAL_OIL | Status: DC | PRN

## 2019-09-09 MED ORDER — ZOLPIDEM TARTRATE 5 MG PO TABS: 5.0000 mg | ORAL_TABLET | Freq: Every evening | ORAL | Status: DC | PRN

## 2019-09-09 MED ORDER — SENNOSIDES-DOCUSATE SODIUM 8.6-50 MG PO TABS
2.0000 | ORAL_TABLET | ORAL | Status: DC
  Administered 2019-09-10 – 2019-09-12 (×3): 2 via ORAL
  Filled 2019-09-09 (×3): qty 2

## 2019-09-09 MED ORDER — SODIUM CHLORIDE 0.9 % IV SOLN
5.0000 10*6.[IU] | Freq: Once | INTRAVENOUS | Status: AC
  Administered 2019-09-09: 5 10*6.[IU] via INTRAVENOUS
  Filled 2019-09-09: qty 5

## 2019-09-09 MED ORDER — IBUPROFEN 600 MG PO TABS
600.0000 mg | ORAL_TABLET | Freq: Four times a day (QID) | ORAL | Status: DC
  Administered 2019-09-10 – 2019-09-12 (×9): 600 mg via ORAL
  Filled 2019-09-09 (×10): qty 1

## 2019-09-09 MED ORDER — OXYCODONE-ACETAMINOPHEN 5-325 MG PO TABS: 1.0000 | ORAL_TABLET | ORAL | Status: DC | PRN

## 2019-09-09 MED ORDER — EPHEDRINE 5 MG/ML INJ: 10.0000 mg | INTRAVENOUS | Status: DC | PRN

## 2019-09-09 MED ORDER — SOD CITRATE-CITRIC ACID 500-334 MG/5ML PO SOLN: 30.0000 mL | ORAL | Status: DC | PRN

## 2019-09-09 MED ORDER — SIMETHICONE 80 MG PO CHEW: 80.0000 mg | CHEWABLE_TABLET | ORAL | Status: DC | PRN

## 2019-09-09 MED ORDER — NIFEDIPINE 10 MG PO CAPS
10.0000 mg | ORAL_CAPSULE | Freq: Once | ORAL | Status: AC
  Administered 2019-09-09: 07:00:00 10 mg via ORAL
  Filled 2019-09-09: qty 1

## 2019-09-09 MED ORDER — PROMETHAZINE HCL 25 MG/ML IJ SOLN
12.5000 mg | Freq: Once | INTRAMUSCULAR | Status: AC
  Administered 2019-09-09: 06:00:00 12.5 mg via INTRAVENOUS
  Filled 2019-09-09: qty 1

## 2019-09-09 MED ORDER — FENTANYL-BUPIVACAINE-NACL 0.5-0.125-0.9 MG/250ML-% EP SOLN
12.0000 mL/h | EPIDURAL | Status: DC | PRN
  Filled 2019-09-09: qty 250

## 2019-09-09 MED ORDER — LACTATED RINGERS IV SOLN
500.0000 mL | Freq: Once | INTRAVENOUS | Status: AC
  Administered 2019-09-09: 14:00:00 500 mL via INTRAVENOUS

## 2019-09-09 MED ORDER — LACTATED RINGERS IV SOLN: 500.0000 mL | INTRAVENOUS | Status: DC | PRN

## 2019-09-09 NOTE — Anesthesia Preprocedure Evaluation (Signed)

## 2019-09-09 NOTE — Anesthesia Procedure Notes (Signed)
Epidural Patient location during procedure: OB Start time: 09/09/2019 2:22 PM End time: 09/09/2019 2:32 PM  Staffing Anesthesiologist: Leonides Grills, MD Performed: anesthesiologist   Preanesthetic Checklist Completed: patient identified, IV checked, site marked, risks and benefits discussed, monitors and equipment checked, pre-op evaluation and timeout performed  Epidural Patient position: sitting Prep: DuraPrep Patient monitoring: heart rate, cardiac monitor, continuous pulse ox and blood pressure Approach: midline Location: L4-L5 Injection technique: LOR air  Needle:  Needle type: Tuohy  Needle gauge: 17 G Needle length: 9 cm Needle insertion depth: 5 cm Catheter type: closed end flexible Catheter size: 19 Gauge Catheter at skin depth: 10 cm Test dose: negative and Other  Assessment Events: blood not aspirated, injection not painful, no injection resistance and negative IV test  Additional Notes Informed consent obtained prior to proceeding including risk of failure, 1% risk of PDPH, risk of minor discomfort and bruising. Discussed alternatives to epidural analgesia and patient desires to proceed.  Timeout performed pre-procedure verifying patient name, procedure, and platelet count.  Patient tolerated procedure well. Reason for block:procedure for pain

## 2019-09-09 NOTE — MAU Note (Signed)
PT SAYS SHE WAS HERE IN YESTERDAY AFTERNOON.   SENT  HOME.  UC ARE STRONGER .

## 2019-09-09 NOTE — MAU Provider Note (Addendum)
Chief Complaint:  Contractions   Seen by provider at 0500hrs   HPI: Amber Gibson is a 19 y.o. G1P0000 at 37w6dwho presents to maternity admissions reporting painful contractions.    She reports good fetal movement, denies LOF, vaginal bleeding, vaginal itching/burning, urinary symptoms, h/a, dizziness, n/v, diarrhea, constipation or fever/chills.  She denies headache, visual changes or RUQ abdominal pain.  Abdominal Pain This is a new problem. The current episode started today. The onset quality is gradual. The problem occurs intermittently. The problem has been gradually worsening. The pain is located in the suprapubic region, LLQ and RLQ. The quality of the pain is cramping. The abdominal pain does not radiate. Pertinent negatives include no constipation, diarrhea, dysuria, fever, frequency, myalgias, nausea or vomiting. Nothing aggravates the pain. The pain is relieved by nothing. She has tried nothing for the symptoms.     Past Medical History: Past Medical History:  Diagnosis Date  . Menometrorrhagia   . Orthostatic proteinuria   . Vitamin D deficiency     Past obstetric history: OB History  Gravida Para Term Preterm AB Living  1 0 0 0 0 0  SAB TAB Ectopic Multiple Live Births  0 0 0 0      # Outcome Date GA Lbr Len/2nd Weight Sex Delivery Anes PTL Lv  1 Current             Past Surgical History: Past Surgical History:  Procedure Laterality Date  . NO PAST SURGERIES      Family History: Family History  Problem Relation Age of Onset  . Lupus Maternal Grandmother   . Diabetes Maternal Grandmother   . Heart disease Maternal Grandmother   . Hypertension Maternal Grandmother   . COPD Maternal Grandmother   . Diabetes Maternal Grandfather   . Heart disease Maternal Grandfather   . Hypertension Maternal Grandfather   . Cancer Neg Hx   . Stroke Neg Hx     Social History: Social History   Tobacco Use  . Smoking status: Never Smoker  . Smokeless tobacco:  Never Used  Substance Use Topics  . Alcohol use: No    Alcohol/week: 0.0 standard drinks  . Drug use: No    Allergies: No Known Allergies  Meds:  Medications Prior to Admission  Medication Sig Dispense Refill Last Dose  . doxylamine, Sleep, (UNISOM SLEEPTABS) 25 MG tablet Take 25 mg by mouth at bedtime as needed.   Past Week at Unknown time  . ondansetron (ZOFRAN) 4 MG tablet Take 4 mg by mouth every 8 (eight) hours as needed for nausea or vomiting.   Past Week at Unknown time  . Prenat-Fe Carbonyl-FA-Omega 3 (ONE-A-DAY WOMENS PRENATAL 1 PO) Take by mouth.   09/08/2019 at Unknown time  . promethazine (PHENERGAN) 25 MG tablet Take 1 tablet (25 mg total) by mouth every 6 (six) hours as needed for nausea or vomiting. 30 tablet 1 More than a month at Unknown time  . vitamin B-6 (PYRIDOXINE) 25 MG tablet Take 25 mg by mouth daily.   More than a month at Unknown time  . Vitamin D, Ergocalciferol, (DRISDOL) 1.25 MG (50000 UT) CAPS capsule Take 1 capsule (50,000 Units total) by mouth every 7 (seven) days. (Patient not taking: Reported on 02/18/2019) 4 capsule 1     I have reviewed patient's Past Medical Hx, Surgical Hx, Family Hx, Social Hx, medications and allergies.   ROS:  Review of Systems  Constitutional: Negative for fever.  Gastrointestinal: Positive for abdominal pain.  Negative for constipation, diarrhea, nausea and vomiting.  Genitourinary: Negative for dysuria and frequency.  Musculoskeletal: Negative for myalgias.   Other systems negative  Physical Exam   Patient Vitals for the past 24 hrs:  BP Temp Temp src Pulse Resp SpO2 Height Weight  09/09/19 0736 118/74 98.5 F (36.9 C) Oral (!) 115 16 100 % -- --  09/09/19 1610 117/61 -- -- -- -- -- -- --  09/09/19 0600 120/78 99.3 F (37.4 C) Oral 99 16 -- -- --  09/09/19 0500 -- -- -- -- -- 100 % -- --  09/09/19 0455 -- -- -- -- -- 100 % -- --  09/09/19 0428 122/73 98.4 F (36.9 C) Oral (!) 103 20 -- 5\' 3"  (1.6 m) 70 kg    Constitutional: Well-developed, well-nourished female in no acute distress.  Cardiovascular: normal rate and rhythm Respiratory: normal effort, clear to auscultation bilaterally GI: Abd soft, non-tender, gravid appropriate for gestational age.   No rebound or guarding. MS: Extremities nontender, no edema, normal ROM Neurologic: Alert and oriented x 4.  GU: Neg CVAT.  PELVIC EXAM:  Dilation: 4 Effacement (%): 90 Station: -1 Presentation: Vertex(confirmed by bedside u/s) Exam by:: Bryden Darden np  Second exam was unchanged except for bulging membranes  FHT:  Baseline 140 , moderate variability, accelerations present, no decelerations (initially had two small variable decelerations, resolved with position change.  Other apparent decels were artifact by loss of signal   Contractions: q 5 mins Irregular     Labs: Results for orders placed or performed during the hospital encounter of 09/09/19 (from the past 24 hour(s))  Urinalysis, Routine w reflex microscopic     Status: Abnormal   Collection Time: 09/09/19  4:40 AM  Result Value Ref Range   Color, Urine YELLOW YELLOW   APPearance CLEAR CLEAR   Specific Gravity, Urine 1.035 (H) 1.005 - 1.030   pH 5.0 5.0 - 8.0   Glucose, UA 50 (A) NEGATIVE mg/dL   Hgb urine dipstick NEGATIVE NEGATIVE   Bilirubin Urine NEGATIVE NEGATIVE   Ketones, ur 80 (A) NEGATIVE mg/dL   Protein, ur 09/11/19 (A) NEGATIVE mg/dL   Nitrite NEGATIVE NEGATIVE   Leukocytes,Ua TRACE (A) NEGATIVE   RBC / HPF 0-5 0 - 5 RBC/hpf   WBC, UA 6-10 0 - 5 WBC/hpf   Bacteria, UA RARE (A) NONE SEEN   Squamous Epithelial / LPF 6-10 0 - 5   Mucus PRESENT   Type and screen [facility]     Status: None (Preliminary result)   Collection Time: 09/09/19  5:45 AM  Result Value Ref Range   ABO/RH(D) PENDING    Antibody Screen PENDING    Sample Expiration      09/12/2019,2359 Performed at [facility], [address], [place], [place]    --/--/PENDING  (01/13 12-02-1998)  Imaging:  No results found.  MAU Course/MDM: I have ordered labs and reviewed results. 8119 WNL NST reviewed, reactive with no further decels. States is very much in pain.  Wants to be admitted. Explained she is preterm and not changing cervix.  We gave Percocet for pain and One dose of Procardia to slow contractions,which worked somewhat.  Second cervix exam showed no change in dilation but now there are bulging membranes.  There is a change since yesterday from 1-2/80% to 2.5-3cm/90%, but no change since then.  She had some vomiting just prior to being discharged, so I ordered Zofran Report given to oncoming provider  Assessment: Single intrauterine pregnancy at [redacted]w[redacted]d Preterm uterine contractions Slight change in cervix.   Plan:   Wynelle Bourgeois CNM, MSN Certified Nurse-Midwife 09/09/2019 7:15 AM    Care assumed from Wynelle Bourgeois CNM at 0800  Patient continues to have contractions. Cervical exam now 4-4.5/90/BBOW & bloody show.  [facility] CNM Rhea Pink) notified of patient's status & admission orders have been placed  A:  1. Active preterm labor, single or unspecified fetus   2. [redacted] weeks gestation of pregnancy     P: Admit to birthing suites   Judeth Horn, NP

## 2019-09-09 NOTE — Progress Notes (Addendum)
19 y.o. G1P0000 [redacted]w[redacted]d admitted for preterm labor, BMZ x1 dose given, second dose due 09/10/19 @ 0828  Subjective:   S/P Fentanyl x2 doses for pain and requested epidural. Comfortable w/ epidural and resting. Discussed management plan to delay AROM/augmenation until second dose of betamethasone understanding that steroids may not be mature and fetus may not experience full benefit of steroids prior to birth if birth occurs <24 hrs after second dose. Pt agrees w/ plan and questions addressed.   Objective:    VS: BP (!) 110/58   Pulse 82   Temp 99 F (37.2 C) (Oral)   Resp 18   Ht 5\' 3"  (1.6 m)   Wt 70 kg   LMP 01/09/2019 (Exact Date)   SpO2 99%   BMI 27.35 kg/m  FHR : baseline 135 / variability moderate / accelerations present / absent decelerations Toco: contractions irregular Membranes: bulging Dilation: 4.5 Effacement (%): 90 Station: -1 Presentation: Vertex Exam by:: [email or web address], RN   Assessment/Plan:   19 y.o. G1P0000 [redacted]w[redacted]d  Labor: Early labor, AROM after second BMZ dose due 09/10/19 @ 0828 Fetal Wellbeing:  Category I Pain Control:  Epidural I/D:  GBS positive, adequate treatment w/ PCN Anticipated MOD:  NSVD  09/12/19 CNM, MSN 09/09/2019 4:57 PM   Reviewed with CNM Continuous Epidural with early labor, committed to delivery, in late preterm baby s/p BMZ x1 AND GBS+ Recommend to proceed with active management of labor with AROM

## 2019-09-09 NOTE — MAU Note (Signed)
Covid swab collected

## 2019-09-09 NOTE — Plan of Care (Signed)

## 2019-09-09 NOTE — H&P (Signed)
OB ADMISSION/ HISTORY & PHYSICAL:  Admission Date: 09/09/2019  9:46 AM  Admit Diagnosis: Preterm labor  Amber Gibson is a 19 y.o. female G1P0000 [redacted]w[redacted]d presenting for painful ctx. Endorses active FM, denies LOF and vaginal bleeding. Ctx began on 09/07/19 in the AM and pt was treated and released from MAU on 1/12 for ctx w/o cervical change. Pt returned w/ increased painful ctx. Cervical change noted w/ bulging membranes. BMZ   History of current pregnancy: G1P0000   Patient entered care with [facility] at 13 wks.   EDC of 10/08/19 was established by [place] @ 13 wk    Anatomy scan:  19+4 wks, with normal findings and posterior placenta.   Antenatal testing: none  Significant prenatal events:  Patient Active Problem List   Diagnosis Date Noted  . Indication for care in labor and delivery, antepartum 09/09/2019  . Thalassemic syndrome 09/07/2019  . Supervision of normal first pregnancy, antepartum 03/04/2019  . Menometrorrhagia 07/14/2015    Prenatal Labs: ABO, Rh: --/--/O POS, O POS Performed at [facility], [place] [address] [place], [place] [address]  [phone]) Antibody: NEG (01/13 0545) Rubella: 8.23 (06/24 0943)  RPR: Non Reactive (06/24 0943)  HBsAg: Negative (06/24 0943)  HIV: Non Reactive (06/24 0943)  GTT: passed GBS: --/POSITIVE (01/13 0950)  GC/CHL: negative/negative Genetics: declined    OB History  Gravida Para Term Preterm AB Living  1 0 0 0 0 0  SAB TAB Ectopic Multiple Live Births  0 0 0 0      # Outcome Date GA Lbr Len/2nd Weight Sex Delivery Anes PTL Lv  1 Current             Medical / Surgical History: Past medical history:  Past Medical History:  Diagnosis Date  . Menometrorrhagia   . Orthostatic proteinuria   . Vitamin D deficiency     Past surgical history:  Past Surgical History:  Procedure Laterality Date  . NO PAST SURGERIES     Family History:  Family History  Problem Relation Age of Onset  . Lupus Maternal Grandmother   .  Diabetes Maternal Grandmother   . Heart disease Maternal Grandmother   . Hypertension Maternal Grandmother   . COPD Maternal Grandmother   . Diabetes Maternal Grandfather   . Heart disease Maternal Grandfather   . Hypertension Maternal Grandfather   . Cancer Neg Hx   . Stroke Neg Hx     Social History:  reports that she has never smoked. She has never used smokeless tobacco. She reports that she does not drink alcohol or use drugs.  Allergies: Patient has no known allergies.   Current Medications at time of admission:  Prior to Admission medications   Medication Sig Start Date End Date Taking? Authorizing Provider  acetaminophen (TYLENOL) 500 MG tablet Take 1,000 mg by mouth every 6 (six) hours as needed for mild pain.   Yes [provider]  calcium carbonate (TUMS - DOSED IN MG ELEMENTAL CALCIUM) 500 MG chewable tablet Chew 1 tablet by mouth 2 (two) times daily as needed for indigestion or heartburn.   Yes [provider]  doxylamine, Sleep, (UNISOM SLEEPTABS) 25 MG tablet Take 25 mg by mouth at bedtime as needed (For nausea.).    Yes [provider]  ondansetron (ZOFRAN) 4 MG tablet Take 4 mg by mouth every 8 (eight) hours as needed for nausea or vomiting.   Yes [provider]  Prenatal Vit-Fe Fumarate-FA (PRENATAL MULTIVITAMIN) TABS  tablet Take 1 tablet by mouth daily at 12 noon.   Yes [provider]  promethazine (PHENERGAN) 25 MG tablet Take 1 tablet (25 mg total) by mouth every 6 (six) hours as needed for nausea or vomiting. Patient not taking: Reported on 09/09/2019 03/23/19   Oswaldo Conroy, CNM    Review of Systems: Constitutional: Negative   HENT: Negative   Eyes: Negative   Respiratory: Negative   Cardiovascular: Negative   Gastrointestinal: Negative  Genitourinary: positive for bloody show, negative for LOF   Musculoskeletal: Negative   Skin: Negative   Neurological: Negative   Endo/Heme/Allergies: Negative    Psychiatric/Behavioral: Negative    Physical Exam: VS: Blood pressure 115/67, pulse 84, temperature 99 F (37.2 C), temperature source Oral, resp. rate 18, height 5\' 3"  (1.6 m), weight 70 kg, last menstrual period 01/09/2019, SpO2 99 %. AAO x3, no signs of distress Cardiovascular: RRR Respiratory: Lung fields clear to ausculation GU/GI: Abdomen gravid, non-tender, non-distended, active FM, vertex, EFW 6.5# per Leopold's Extremities: no edema, negative for pain, tenderness, and cords  Cervical exam:Dilation: 4.5 Effacement (%): 90 Station: -1 Exam by:: [email or web address], RN  FHR: baseline rate 140 / variability moderate / accelerations present / absent decelerations TOCO: 3-4 min   Prenatal Transfer Tool  Maternal Diabetes: No Genetic Screening: Declined Maternal Ultrasounds/Referrals: Normal Fetal Ultrasounds or other Referrals:  None Maternal Substance Abuse:  No Significant Maternal Medications:  None Significant Maternal Lab Results: Group B Strep positive    Assessment: 18 y.o. G1P0000 [redacted]w[redacted]d admitted for preterm labor  Early stage of labor FHR category 1 GBS positive Pain management plan: IV sedation and epidural   Plan:  Admit to L&D Routine admission orders Epidural PRN PCN for GBS prophylaxis BMZ dose #1 given in MAU Delay birth until adequate treatment for GBS and second BMZ dose  Dr [redacted]w[redacted]d notified of admission and plan of care  Estanislado Pandy CNM, MSN 09/09/2019 2:56 PM

## 2019-09-09 NOTE — Progress Notes (Signed)
Labor Progress Note  19 y.o.G1P0000 [redacted]w[redacted]d admitted for preterm labor, BMZ x1 dose given, second dose due 09/10/19 @ 0828  Subjective: Pt stable and in bed, discussed AROM per Dr Estanislado Pandy, also discussed pitocin, Reviewed R/B/A, pt amenable to both, denies further questions. Pt comfortable with epidural and partner at bedside, and supportive.  Patient Active Problem List   Diagnosis Date Noted  . Indication for care in labor and delivery, antepartum 09/09/2019  . Thalassemic syndrome 09/07/2019  . Supervision of normal first pregnancy, antepartum 03/04/2019  . Menometrorrhagia 07/14/2015   Objective: BP 129/78   Pulse 99   Temp 99.5 F (37.5 C) (Oral)   Resp 17   Ht 5\' 3"  (1.6 m)   Wt 70 kg   LMP 01/09/2019 (Exact Date)   SpO2 99%   BMI 27.35 kg/m  No intake/output data recorded. No intake/output data recorded. NST: FHR baseline 125 bpm, Variability: moderate, Accelerations:present, Decelerations:  Absent= Cat 1/Reactive, x1 variable with AROM, returned to baseline spontaneously.  CTX:  irregular, every 2-6 minutes, l;asting 60-100 seconds Uterus gravid, soft non tender, moderate to palpate with contractions.  SVE:  Dilation: 8 Effacement (%): 100 Station: Plus 2 Exam by:: [facility], CNM  AROM, moderate amount, tolerated well. Bloody show.  Pitocin at (to start now) mUn/min  Assessment:  19 y.o.G1P0000 [redacted]w[redacted]d admitted for preterm labor, BMZ x1 dose given, second dose due 09/10/19 @ 0828. AROM, clear tolerated well, comfortable with epidural, progressing in active labor, irregular occasional cxt.  Patient Active Problem List   Diagnosis Date Noted  . Indication for care in labor and delivery, antepartum 09/09/2019  . Thalassemic syndrome 09/07/2019  . Supervision of normal first pregnancy, antepartum 03/04/2019  . Menometrorrhagia 07/14/2015   NICHD: Category 1  Membranes:  AROM, clear moderate amount with bloody show x 0hrs, no s/s of infection  Augmention:     Pitocin - Plant to start at 2 now  Pain management:               Epidural placement:  at 1/13 on 1530  GBS postive  Abx: penicillin x2 doses given    Plan: Continue labor plan Continuous monitoring Rest Frequent position changes to facilitate fetal rotation and descent. Will reassess with cervical exam if necessary and indicated by pt feeling pressure or urge to push  Start pitocin per protocol Anticipate labor progression and vaginal delivery.   2/13, NP-C, CNM, MSN 09/09/2019. 8:40 PM

## 2019-09-10 ENCOUNTER — Encounter (HOSPITAL_COMMUNITY): Payer: Self-pay | Admitting: Obstetrics and Gynecology

## 2019-09-10 LAB — CBC
HCT: 31.8 % — ABNORMAL LOW (ref 36.0–46.0)
Hemoglobin: 10.6 g/dL — ABNORMAL LOW (ref 12.0–15.0)
MCH: 27.7 pg (ref 26.0–34.0)
MCHC: 33.3 g/dL (ref 30.0–36.0)
MCV: 83 fL (ref 80.0–100.0)
Platelets: 196 10*3/uL (ref 150–400)
RBC: 3.83 MIL/uL — ABNORMAL LOW (ref 3.87–5.11)
RDW: 13.3 % (ref 11.5–15.5)
WBC: 18.8 10*3/uL — ABNORMAL HIGH (ref 4.0–10.5)
nRBC: 0 % (ref 0.0–0.2)

## 2019-09-10 LAB — CULTURE, BETA STREP (GROUP B ONLY)

## 2019-09-10 LAB — RPR: RPR Ser Ql: NONREACTIVE

## 2019-09-10 NOTE — Progress Notes (Signed)
PPD# 1 SVD w/ left labial laceration  Information for the patient's newborn:  Chrishelle, Zito Girl Alisah [[id number]]  female       S:   Reports feeling good Tolerating PO fluid and solids No nausea or vomiting Bleeding is light, no clots Pain controlled with PO meds Up ad lib / ambulatory / voiding w/o difficulty Feeding: Bottle    O:   VS: BP 98/62 (BP Location: Left Arm)   Pulse 93   Temp 97.8 F (36.6 C) (Axillary)   Resp 17   Ht 5\' 3"  (1.6 m)   Wt 70 kg   LMP 01/09/2019 (Exact Date)   SpO2 98%   Breastfeeding Unknown   BMI 27.35 kg/m   LABS:  Recent Labs    09/09/19 0826 09/10/19 0534  WBC 12.8* 18.8*  HGB 12.0 10.6*  PLT 220 196   Blood type: --/--/O POS, O POS Performed at [facility], [address] [place], [place]  [phone] (63/84) Rubella: 8.23 (06/24 [birth year]1987)                      I&O: Intake/Output      01/13 0701 - 01/14 0700 01/14 0701 - 01/15 0700   I.V. (mL/kg) 0 (0)    Other 0    Total Intake(mL/kg) 0 (0)    Urine (mL/kg/hr) 701 (0.4)    Blood 169    Total Output 870    Net -870         Urine Occurrence 1 x      Physical Exam: Alert and oriented X3 Lungs: Clear and unlabored Heart: regular rate and rhythm / no mumurs Abdomen: soft, non-tender, non-distended  Fundus: firm, non-tender Perineum: non-edematous Lochia: appropriate Extremities: no edema, no calf pain or tenderness    A:  PPD # 1 S/P SVD late preterm Normal exam  P:  Routine post partum orders Anticipate D/C on 09/11/19   09/13/19, MSN, CNM 09/10/2019, 10:04 AM

## 2019-09-10 NOTE — Anesthesia Postprocedure Evaluation (Signed)
Anesthesia Post Note  Patient: Amber Gibson  Procedure(s) Performed: AN AD HOC LABOR EPIDURAL     Patient location during evaluation: Mother Baby Anesthesia Type: Epidural Level of consciousness: awake and alert Vital Signs Assessment: post-procedure vital signs reviewed and stable Respiratory status: spontaneous breathing Cardiovascular status: stable Postop Assessment: no headache, adequate PO intake, no backache, patient able to bend at knees, able to ambulate, epidural receding and no apparent nausea or vomiting Anesthetic complications: no    Last Vitals:  Vitals:   09/10/19 0840 09/10/19 1218  BP: 98/62 108/76  Pulse: 93 83  Resp: 17 18  Temp: 36.6 C (!) 36.4 C  SpO2:      Last Pain:  Vitals:   09/10/19 1218  TempSrc: Axillary  PainSc:    Pain Goal: Patients Stated Pain Goal: 0 (09/10/19 1019)                 [name], Weldon Picking

## 2019-09-11 ENCOUNTER — Encounter (HOSPITAL_COMMUNITY): Payer: Self-pay | Admitting: Obstetrics and Gynecology

## 2019-09-11 LAB — SURGICAL PATHOLOGY

## 2019-09-11 NOTE — Progress Notes (Addendum)
Subjective: Postpartum Day 1: Vaginal delivery, left labial  laceration Patient up ad lib, reports no syncope or dizziness. Feeding:  Bottle Contraceptive plan:  Depo  Objective: Vital signs in last 24 hours: Temp:  [97.5 F (36.4 C)-97.9 F (36.6 C)] 97.9 F (36.6 C) (01/15 0521) Pulse Rate:  [74-83] 74 (01/15 0521) Resp:  [16-18] 16 (01/15 0521) BP: (96-108)/(60-76) 99/60 (01/15 0521) SpO2:  [100 %] 100 % (01/15 0521)  Physical Exam:  General: alert, cooperative and no distress Lochia: appropriate Uterine Fundus: firm Perineum: healing well DVT Evaluation: No evidence of DVT seen on physical exam.   CBC Latest Ref Rng & Units 09/10/2019 09/09/2019 02/18/2019  WBC 4.0 - 10.5 K/uL 18.8(H) 12.8(H) 7.5  Hemoglobin 12.0 - 15.0 g/dL 10.6(L) 12.0 13.3  Hematocrit 36.0 - 46.0 % 31.8(L) 37.5 39.7  Platelets 150 - 400 K/uL 196 220 299     Assessment/Plan: Status post vaginal delivery day 1. Stable Continue current care. Plan for discharge tomorrow and Contraception Depo Provera    Henderson Newcomer ProtheroCNM 09/11/2019, 9:13 AM

## 2019-09-12 MED ORDER — IBUPROFEN 600 MG PO TABS: 600.0000 mg | ORAL_TABLET | Freq: Four times a day (QID) | ORAL | 0 refills | Status: DC

## 2019-09-12 NOTE — Discharge Summary (Signed)
  Discharge Summary  Patient ID: Amber Gibson [id number] 19 y.o. 07-May-2001  Admit date: 09/09/2019  Obstetric Discharge Summary Amber Gibson is a 19 year old G1P0 now PPD#3 who presented in active PTL at 35.6 weeks Unknown GBS status, however treated BMZ given x 2 Cleared by NICU to remain couplet care Uneventful PP course Condoms for birth control at this time    Hemoglobin  Date Value Ref Range Status  09/10/2019 10.6 (L) 12.0 - 15.0 g/dL Final  61/95/0932 67.1 11.1 - 15.9 g/dL Final   HCT  Date Value Ref Range Status  09/10/2019 31.8 (L) 36.0 - 46.0 % Final   Hematocrit  Date Value Ref Range Status  02/18/2019 39.7 34.0 - 46.6 % Final    Physical Exam:  General: alert, cooperative and no distress Lochia: appropriate Uterine Fundus: firm Incision: NA DVT Evaluation: No evidence of DVT seen on physical exam. VS: Temp 98.2, BP 105/67, Pulse 71, RR 17  Discharge Diagnoses: Preterm Delivery  Discharge Information: Date: 09/12/2019 Activity: pelvic rest Diet: routine Medications: Ibuprofen Condition: stable Instructions: refer to practice specific booklet Discharge to: home Follow-up Information    Ob/[facility]yn, [facility] Follow up in 6 day(s).   Specialty: Obstetrics and Gynecology Contact information: [address] [place] [address] [phone]           Newborn Data: Live born female  Birth Weight: 5 lb 4 oz (2381 g) APGAR: 6, 7  Newborn Delivery   Birth date/time: 09/09/2019 21:12:00 Delivery type: Vaginal, Spontaneous      Home with mother.  Victorino Dike B Amber Gibson 09/12/2019, 10:26 AM

## 2019-10-30 ENCOUNTER — Encounter: Payer: Self-pay | Admitting: Family Medicine

## 2019-12-29 ENCOUNTER — Encounter: Payer: Self-pay | Admitting: Dermatology

## 2019-12-29 ENCOUNTER — Other Ambulatory Visit: Payer: Self-pay

## 2019-12-29 ENCOUNTER — Ambulatory Visit (INDEPENDENT_AMBULATORY_CARE_PROVIDER_SITE_OTHER): Payer: BC Managed Care – PPO | Admitting: Dermatology

## 2019-12-29 DIAGNOSIS — L700 Acne vulgaris: Secondary | ICD-10-CM

## 2019-12-29 MED ORDER — MINOCYCLINE HCL 50 MG PO CAPS: 50.0000 mg | ORAL_CAPSULE | Freq: Two times a day (BID) | ORAL | 1 refills | Status: DC

## 2019-12-29 NOTE — Patient Instructions (Addendum)
First visit for Amber Gibson a 19 year old who has once previously seen a dermatologist and was prescribed tretinoin for her acne which produced moderate irritation.  Although her active acne is of moderate severity, she does have noticeable hyperpigmentation and some pitted scarring.  The face is predominantly involved.  We discussed the causes of acne and spent some time reviewing the possible benefits and negative aspects of Accutane therapy.  Initially she will take low-dose oral minocycline, 1 pill twice daily but she should not double up if she misses a pill.  Possible side effects could include stomach upset, yeast, rash, or dizziness.  She will restart over-the-counter Differin gel initially applying it to areas prone to acne every other night. Routine follow-up scheduled for 2 months.  She knows she could call me if there are any problem.

## 2020-01-01 ENCOUNTER — Encounter: Payer: Self-pay | Admitting: Dermatology

## 2020-01-01 NOTE — Progress Notes (Signed)
   New Patient   Subjective  Amber Gibson is a 19 y.o. female who presents for the following: New Patient (Initial Visit) (Patient here today for acne and hyperpigmentation x 10 years.  Patient has tired and failed OTC treatments and she was given an Rx for Tretinoin by [facility].  Patient also tried Clindamycin/Azelaic Acid/Niacinamoide Cream no improvement per patient).  Acne Location: Predominantly face Duration: Several years Quality: Never clears Associated Signs/Symptoms: Bumps and discoloration Modifying Factors: Previous tretinoin produce some irritation Severity:  Timing: Context:    The following portions of the chart were reviewed this encounter and updated as appropriate:     Objective  Well appearing patient in no apparent distress; mood and affect are within normal limits.  A focused examination was performed including face, neck, chest and back. Relevant physical exam findings are noted in the Assessment and Plan.   Assessment & Plan  Acne vulgaris (2) Head - Anterior (Face)  Differin 0.1%, mcn 50mg  b.I.d. #60, 1RF, f/u 62mos  Ordered Medications: minocycline (MINOCIN) 50 MG capsule

## 2020-01-19 ENCOUNTER — Encounter: Payer: Self-pay | Admitting: Family Medicine

## 2020-01-20 ENCOUNTER — Telehealth: Payer: Self-pay

## 2020-01-20 NOTE — Telephone Encounter (Signed)
Contacted pt in error. Did not mean to call this pt.

## 2020-01-21 ENCOUNTER — Encounter: Payer: Self-pay | Admitting: Internal Medicine

## 2020-01-21 ENCOUNTER — Other Ambulatory Visit: Payer: Self-pay

## 2020-01-21 ENCOUNTER — Telehealth: Payer: Self-pay

## 2020-01-21 ENCOUNTER — Ambulatory Visit (INDEPENDENT_AMBULATORY_CARE_PROVIDER_SITE_OTHER): Payer: BC Managed Care – PPO | Admitting: Internal Medicine

## 2020-01-21 VITALS — BP 108/70 | HR 96 | Temp 97.9°F | Resp 16 | Ht 64.0 in | Wt 137.9 lb

## 2020-01-21 DIAGNOSIS — L709 Acne, unspecified: Secondary | ICD-10-CM | POA: Diagnosis not present

## 2020-01-21 DIAGNOSIS — L239 Allergic contact dermatitis, unspecified cause: Secondary | ICD-10-CM | POA: Diagnosis not present

## 2020-01-21 DIAGNOSIS — L509 Urticaria, unspecified: Secondary | ICD-10-CM | POA: Diagnosis not present

## 2020-01-21 MED ORDER — PREDNISONE 10 MG PO TABS: ORAL_TABLET | ORAL | 0 refills | Status: DC

## 2020-01-21 NOTE — Telephone Encounter (Signed)
Called to triage, but pt has appt

## 2020-01-21 NOTE — Patient Instructions (Signed)
Hives Hives are itchy, red, swollen areas on your skin. Hives can show up on any part of your body. Hives often fade within 24 hours (acute hives). New hives can show up after old ones fade. This can go on for many days or weeks (chronic hives). Hives do not spread from person to person (are not contagious). Hives are caused by your body's response to something that you are allergic to (allergen). These are sometimes called triggers. You can get hives right after being around a trigger, or hours later. What are the causes?  Allergies to foods.  Insect bites or stings.  Pollen.  Pets.  Latex.  Chemicals.  Spending time in sunlight, heat, or cold.  Exercise.  Stress.  Some medicines.  Viruses. This includes the common cold.  Infections caused by germs (bacteria).  Allergy shots.  Blood transfusions. Sometimes, the cause is not known. What increases the risk?  Being a woman.  Being allergic to foods such as: ? Citrus fruits. ? Milk. ? Eggs. ? Peanuts. ? Tree nuts. ? Shellfish.  Being allergic to: ? Medicines. ? Latex. ? Insects. ? Animals. ? Pollen. What are the signs or symptoms?   Raised, itchy, red or white bumps or patches on your skin. These areas may: ? Get large and swollen. ? Change in shape and location. ? Stand alone or connect to each other over a large area of skin. ? Sting or hurt. ? Turn white when pressed in the center (blanch). In very bad cases, your hands, feet, and face may also get swollen. This may happen if hives start deeper in your skin. How is this treated? Treatment for this condition depends on your symptoms. Treatment may include:  Using cool, wet cloths (cool compresses) or taking cool showers to stop the itching.  Medicines that help: ? Relieve itching (antihistamines). ? Reduce swelling (corticosteroids). ? Treat infection (antibiotics).  A medicine (omalizumab) that is given as a shot (injection). Your doctor may  prescribe this if you have hives that do not get better even after other treatments.  In very bad cases, you may need a shot of a medicine called epinephrineto prevent a life-threatening allergic reaction (anaphylaxis). Follow these instructions at home: Medicines  Take or apply over-the-counter and prescription medicines only as told by your doctor.  If you were prescribed an antibiotic medicine, use it as told by your doctor. Do not stop using it even if you start to feel better. Skin care  Apply cool, wet cloths to the hives.  Do not scratch your skin. Do not rub your skin. General instructions  Do not take hot showers or baths. This can make itching worse.  Do not wear tight clothes.  Use sunscreen and wear clothes that cover your skin when you are outside.  Avoid any triggers that cause your hives. Keep a journal to help track what causes your hives. Write down: ? What medicines you take. ? What you eat and drink. ? What products you use on your skin.  Keep all follow-up visits as told by your doctor. This is important. Contact a doctor if:  Your symptoms are not better with medicine.  Your joints hurt or are swollen. Get help right away if:  You have a fever.  You have pain in your belly (abdomen).  Your tongue or lips are swollen.  Your eyelids are swollen.  Your chest or throat feels tight.  You have trouble breathing or swallowing. These symptoms may be an emergency.   Do not wait to see if the symptoms will go away. Get medical help right away. Call your local emergency services (911 in the [place] Do not drive yourself to the hospital. Summary  Hives are itchy, red, swollen areas on your skin.  Treatment for this condition depends on your symptoms.  Avoid things that cause your hives. Keep a journal to help track what causes your hives.  Take and apply over-the-counter and prescription medicines only as told by your doctor.  Keep all follow-up visits  as told by your doctor. This is important. This information is not intended to replace advice given to you by your health care provider. Make sure you discuss any questions you have with your health care provider. Document Revised: 02/26/2018 Document Reviewed: 02/26/2018 Elsevier Patient Education  2020 Elsevier Inc.  

## 2020-01-21 NOTE — Progress Notes (Signed)
Patient ID: Amber Gibson, female    DOB: 11/24/2000, 19 y.o.   MRN: [id number]  PCP: Danelle Berry, PA-C  No chief complaint on file.   Subjective:   Amber Gibson is a 19 y.o. female, presents to clinic with CC of the following:  No chief complaint on file.   HPI:  Patient is a 19 year old female patient of Danelle Berry Follows up today with complaints of a rash Mom was present with her today.  She notes that this past Friday, she developed a rash on her forearms, but became slightly raised, more linear, and was very itchy, and then noted some on her chest, her neck, her hairline area.  She went to an urgent care on Saturday, and they prescribed a triamcinolone cream product to use as needed.  She has been putting cool compresses on the areas, a Benadryl product every 6 hours, and a hydrocortisone cream topically, and noted the rash would go away, and then in a couple hours it would recur.  She has tried not to scratch at it. She also noted a couple small red areas most recently on her heel areas.  They are itchy as well. She has had no fevers, has had no new detergents, no new soaps or shampoos, no new foods that she has tried.  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  No shortness of breath or closing feelings. She recently saw dermatology on 12/29/2019 for acne concerns. She was started on Differin gel topically, and also minocycline 1 twice daily, with a follow-up recommended in 2 months.  She has been on minocycline now since May 5, with this starting about 2+ weeks since starting the minocycline product which she takes twice daily. She has no prior allergies to medications.  Patient Active Problem List   Diagnosis Date Noted  . SVD (spontaneous vaginal delivery) 09/12/2019  . Postpartum care following vaginal delivery 09/12/2019  . Indication for care in labor and delivery,  antepartum 09/09/2019  . Thalassemic syndrome 09/07/2019  . Supervision of normal first pregnancy, antepartum 03/04/2019  . Menometrorrhagia 07/14/2015      Current Outpatient Medications:  .  etonogestrel-ethinyl estradiol (NUVARING) 0.12-0.015 MG/24HR vaginal ring, , Disp: , Rfl:  .  minocycline (MINOCIN) 50 MG capsule, Take 1 capsule (50 mg total) by mouth 2 (two) times daily., Disp: 60 capsule, Rfl: 1   No Known Allergies   Past Surgical History:  Procedure Laterality Date  . NO PAST SURGERIES       Family History  Problem Relation Age of Onset  . Lupus Maternal Grandmother   . Diabetes Maternal Grandmother   . Heart disease Maternal Grandmother   . Hypertension Maternal Grandmother   . COPD Maternal Grandmother   . Diabetes Maternal Grandfather   . Heart disease Maternal Grandfather   . Hypertension Maternal Grandfather   . Cancer Neg Hx   . Stroke Neg Hx      Social History   Tobacco Use  . Smoking status: Never Smoker  . Smokeless tobacco: Never Used  Substance Use Topics  . Alcohol use: No    Alcohol/week: 0.0 standard drinks    With staff assistance, above reviewed with the patient today.  ROS: As per HPI, otherwise no specific complaints on a limited and focused system review   No results found for this or any previous visit (from the past 72  hour(s)).   PHQ2/9: Depression screen [facility] 2/9 01/21/2020 05/26/2019 09/15/2018 02/04/2018  Decreased Interest 0 0 2 0  Down, Depressed, Hopeless 0 0 3 0  PHQ - 2 Score 0 0 5 0  Altered sleeping 0 0 2 -  Tired, decreased energy 0 0 2 -  Change in appetite 0 0 3 -  Feeling bad or failure about yourself  0 0 1 -  Trouble concentrating 0 0 2 -  Moving slowly or fidgety/restless 0 0 0 -  Suicidal thoughts 0 0 1 -  PHQ-9 Score 0 0 16 -  Difficult doing work/chores Not difficult at all Not difficult at all Somewhat difficult -   PHQ-2/9 Result is neg  Fall Risk: Fall Risk  01/21/2020 05/26/2019 09/15/2018  02/04/2018  Falls in the past year? 0 1 0 No  Number falls in past yr: 0 0 0 -  Injury with Fall? 0 1 0 -      Objective:   There were no vitals filed for this visit.  There is no height or weight on file to calculate BMI.  Physical Exam   NAD, masked, very pleasant HEENT - [place]/AT, sclera anicteric,conj - non-inj'ed, pharynx clear, no oral mucosal lesions, no facial lesions noted on assessment today. Neck - supple, no adenopathy, no rigidity Car - RRR, not tachycardic Pulm- RR and effort normal at rest, Skin-her right forearm had evidence of a fading erythematous region that was more elevated earlier, along the inner aspect.  Slight redness on the left distal forearm noted at the base of the hand.  No involvement presently of the upper chest and neck area, the back was clear.  On the plantar aspect of the heels bilaterally was just an erythematous nonraised area with no blistering component. Neuro/psychiatric - affect was not flat, appropriate with conversation  Alert, speech normal  She had pictures on her phone which did show a more raised component than was present on exam and more in linear streaks of the raised areas.  Results for orders placed or performed during the hospital encounter of 09/09/19  Respiratory Panel by RT PCR (Flu A&B, Covid) - Nasopharyngeal Swab   Specimen: Nasopharyngeal Swab  Result Value Ref Range   SARS Coronavirus 2 by RT PCR NEGATIVE NEGATIVE   Influenza A by PCR NEGATIVE NEGATIVE   Influenza B by PCR NEGATIVE NEGATIVE  Group B strep by PCR   Specimen: Vaginal/Rectal; Genital  Result Value Ref Range   Group B strep by PCR POSITIVE (A) NEGATIVE  Urinalysis, Routine w reflex microscopic  Result Value Ref Range   Color, Urine YELLOW YELLOW   APPearance CLEAR CLEAR   Specific Gravity, Urine 1.035 (H) 1.005 - 1.030   pH 5.0 5.0 - 8.0   Glucose, UA 50 (A) NEGATIVE mg/dL   Hgb urine dipstick NEGATIVE NEGATIVE   Bilirubin Urine NEGATIVE NEGATIVE    Ketones, ur 80 (A) NEGATIVE mg/dL   Protein, ur 323 (A) NEGATIVE mg/dL   Nitrite NEGATIVE NEGATIVE   Leukocytes,Ua TRACE (A) NEGATIVE   RBC / HPF 0-5 0 - 5 RBC/hpf   WBC, UA 6-10 0 - 5 WBC/hpf   Bacteria, UA RARE (A) NONE SEEN   Squamous Epithelial / LPF 6-10 0 - 5   Mucus PRESENT   CBC  Result Value Ref Range   WBC 12.8 (H) 4.0 - 10.5 K/uL   RBC 4.39 3.87 - 5.11 MIL/uL   Hemoglobin 12.0 12.0 - 15.0 g/dL   HCT 55.7 32.2 -  46.0 %   MCV 85.4 80.0 - 100.0 fL   MCH 27.3 26.0 - 34.0 pg   MCHC 32.0 30.0 - 36.0 g/dL   RDW 16.0 10.9 - 32.3 %   Platelets 220 150 - 400 K/uL   nRBC 0.0 0.0 - 0.2 %  RPR  Result Value Ref Range   RPR Ser Ql NON REACTIVE NON REACTIVE  CBC  Result Value Ref Range   WBC 18.8 (H) 4.0 - 10.5 K/uL   RBC 3.83 (L) 3.87 - 5.11 MIL/uL   Hemoglobin 10.6 (L) 12.0 - 15.0 g/dL   HCT 55.7 (L) 32.2 - 02.5 %   MCV 83.0 80.0 - 100.0 fL   MCH 27.7 26.0 - 34.0 pg   MCHC 33.3 30.0 - 36.0 g/dL   RDW 42.7 06.2 - 37.6 %   Platelets 196 150 - 400 K/uL   nRBC 0.0 0.0 - 0.2 %  Type and screen [facility]  Result Value Ref Range   ABO/RH(D) O POS    Antibody Screen NEG    Sample Expiration      09/12/2019,2359 Performed at [facility], [address] [place], [place] [address]   ABO/Rh  Result Value Ref Range   ABO/RH(D)      O POS Performed at [facility], [address] [place], [place] [address]   Surgical pathology  Result Value Ref Range   SURGICAL PATHOLOGY      SURGICAL PATHOLOGY CASE: [id number] PATIENT: Shiara Fleek Surgical Pathology Report     Clinical History: None provided     FINAL MICROSCOPIC DIAGNOSIS:  A. PLACENTA, [redacted]W[redacted]D: - Mature third trimester placenta (350 g) - Three vessel umbilical cord      GROSS DESCRIPTION:  Specimen received: Placenta including membranes and umbilical cord, singleton, received fresh Size and shape: Discoid, 16 x 15 cm and is up to 2.3 cm thick. Weight: 350  g Umbilical cord: 26 cm in length, averages 1 cm in diameter, centrally inserted and has normal tri-vasculature. Membranes: Smooth, semitranslucent and inserted marginally. Fetal surface: Smooth pink bluegray with mild subchorionic fibrin deposition. Maternal surface: Complete, with mild fibrin and calcium deposition. Cut surface: There is pink-red spongy parenchyma with no discrete lesions or masses. Block summary: Block 1 = membranes and cord Blocks 2-4 = placenta  SW 09/10/2019     Final Diagnosis per formed by Holley Bouche, MD.   Electronically signed 09/11/2019 Technical component performed at [facility], [address] [place], [place] [address].  Professional component performed at [employer], [address], [place], [place] [address].  Immunohistochemistry Technical component (if applicable) was performed at [facility]. [address], [place], [place] [address].   IMMUNOHISTOCHEMISTRY DISCLAIMER (if applicable): Some of these immunohistochemical stains may have been developed and the performance characteristics determine by [facility]. Some may not have been cleared or approved by the U.S. Food and Drug Administration. The FDA has determined that such clearance or approval is not necessary. This test is used for clinical purposes. It should not be regarded as investigational or for research. This laboratory is certified under the Clinical Laboratory Improvement Amendments of 1 988 (CLIA-88) as qualified to perform high complexity clinical laboratory testing.  The controls stained appropriately.        Assessment & Plan:    1. Urticaria/Allergic dermatitis Do feel like this is more of a hive-like process, with that having and flowing  as noted.  Exact source unclear, and noted the differential is quite large potential causes.  The 1 thing that is new is the minocycline addition  about 2 weeks before this started.  Could possibly be a source to this. Felt best to stop the minocycline presently. We will add a prednisone product and rapidly wean recommended. - predniSONE (DELTASONE) 10 MG tablet; Take four tabs daily X 2 days, then two tabs daily X 2 days, then one tab daily X 2 days  Dispense: 14 tablet; Refill: 0 Can continue the Benadryl product as she is taking as well as the topical steroid cream to use sparingly, and can use the cool compresses as she has been applying as it seems to be helpful. Continue to try not to scratch at this also recommended. Also recommended she call the dermatologist and let them know she was seen here, that the minocycline was recommended to be stopped, and if her symptoms remain better, recommendations as alternatives for further management of her acne.  If it is not improving or more problematic, also will be helpful to have dermatology's opinion.  2. Acne, unspecified acne type As above noted.    Await her response to the above      Towanda Malkin, MD 01/21/20 2:03 PM

## 2020-01-29 ENCOUNTER — Encounter: Payer: Self-pay | Admitting: Dermatology

## 2020-02-12 ENCOUNTER — Telehealth: Payer: Self-pay | Admitting: Family Medicine

## 2020-02-12 NOTE — Telephone Encounter (Signed)
Please advise Dr.Hendrickson saw in May

## 2020-02-12 NOTE — Telephone Encounter (Signed)
Pt is requesting a order for TB test to be placed. Please inform pt when its ready.

## 2020-02-16 ENCOUNTER — Other Ambulatory Visit: Payer: Self-pay

## 2020-02-16 ENCOUNTER — Ambulatory Visit (LOCAL_COMMUNITY_HEALTH_CENTER): Payer: Self-pay

## 2020-02-16 DIAGNOSIS — Z111 Encounter for screening for respiratory tuberculosis: Secondary | ICD-10-CM

## 2020-02-16 NOTE — Telephone Encounter (Signed)
  What is the TB test for?  She has an appt with infectious disease today?

## 2020-02-19 ENCOUNTER — Other Ambulatory Visit: Payer: Self-pay

## 2020-02-19 ENCOUNTER — Ambulatory Visit (LOCAL_COMMUNITY_HEALTH_CENTER): Payer: Medicaid Other

## 2020-02-19 DIAGNOSIS — Z111 Encounter for screening for respiratory tuberculosis: Secondary | ICD-10-CM

## 2020-02-19 LAB — TB SKIN TEST
Induration: 0 mm
TB Skin Test: NEGATIVE

## 2020-03-01 ENCOUNTER — Ambulatory Visit: Payer: BC Managed Care – PPO | Admitting: Dermatology

## 2020-10-27 ENCOUNTER — Encounter: Payer: Self-pay | Admitting: Family Medicine

## 2020-11-30 ENCOUNTER — Other Ambulatory Visit: Payer: Self-pay | Admitting: Surgery

## 2020-11-30 DIAGNOSIS — R2231 Localized swelling, mass and lump, right upper limb: Secondary | ICD-10-CM

## 2020-12-05 ENCOUNTER — Ambulatory Visit (INDEPENDENT_AMBULATORY_CARE_PROVIDER_SITE_OTHER): Payer: BC Managed Care – PPO | Admitting: Gastroenterology

## 2020-12-05 ENCOUNTER — Encounter: Payer: Self-pay | Admitting: Gastroenterology

## 2020-12-05 ENCOUNTER — Other Ambulatory Visit: Payer: Self-pay

## 2020-12-05 VITALS — BP 139/79 | HR 101 | Temp 98.1°F | Ht 64.0 in | Wt 139.0 lb

## 2020-12-05 DIAGNOSIS — K529 Noninfective gastroenteritis and colitis, unspecified: Secondary | ICD-10-CM | POA: Diagnosis not present

## 2020-12-05 NOTE — Progress Notes (Signed)
Amber Repress, MD [address]  [address]  [place], [place] [address]  Main: [phone]  Fax: [phone]    Gastroenterology Consultation  Referring Provider:     Danelle Berry, PA-C Primary Care Physician:  Amber Kins, FNP Primary Gastroenterologist:  Dr. Mia Gibson Reason for Consultation:   Chronic diarrhea and epigastric pain        HPI:   Amber Gibson is a 20 y.o. female referred by Dr. Talbert Gibson, Amber Shape, FNP  for consultation & management of chronic diarrhea and epigastric pain.  Patient has been experiencing frequent episodes of nonbloody diarrhea and occasionally with mixed with streaks of blood associated with abdominal cramps, epigastric pain associated with nausea and vomiting.  Patient reports that her symptoms started end of December, beginning of January.  She was originally seen by Dr. Vira Gibson it [facility] clinic gastroenterology office.  She underwent upper endoscopy and colonoscopy which revealed scant, scattered aphthous ulcers in the terminal ileum.  Pathology results revealed acute ileitis only with no evidence of chronicity.  I do not have the official pathology results with me to review today.  She brought a copy of her upper endoscopy and colonoscopy reports with her.  Prior to her endoscopy evaluation, patient was on short course of prednisone with short course of Flagyl by her PCP as her ASCA antibodies came back positive, presuming that she has Crohn's disease.  Based on the pathology results and her symptoms, she was started on short course of prednisone by Dr. Mia Gibson due to high index of clinical suspicion for Crohn's ileitis.  Patient reports that the steroids did not provide any significant relief of her symptoms.  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  She has been gradually losing weight but not at her prepregnancy weight.  She has a 3 months old daughter at home.  She denied any GI symptoms during her pregnancy.  She  does report significant abdominal bloating as well.  She is taking Protonix once a day as well as hyoscyamine 0.125 mg as needed.  Although patient denies stress in her life, her mom thinks she is stressful working as well as taking care of her daughter Patient loves to eat cheese pizza and cheese hamburger almost every other day Patient has significant leukocytosis with normocytic anemia in 08/2019.  Most recent labs from January 2022 at her PCPs office revealed normal CBC, CMP  Patient wanted to transfer her care for management of her GI symptoms  She does not smoke tobacco, denies alcohol use, denies marijuana use or IV drug abuse  NSAIDs: None  Antiplts/Anticoagulants/Anti thrombotics: None  GI Procedures: EGD and colonoscopy 11/24/2020 EGD normal, gastric biopsies were performed Colonoscopy revealed normal colon, aphthous ulcers in the terminal ileum, biopsies performed  Past Medical History:  Diagnosis Date  . Menometrorrhagia   . Orthostatic proteinuria   . Vitamin D deficiency     Past Surgical History:  Procedure Laterality Date  . NO PAST SURGERIES      Current Outpatient Medications:  .  ergocalciferol (VITAMIN D2) 1.25 MG (50000 UT) capsule, Take by mouth., Disp: , Rfl:  .  etonogestrel-ethinyl estradiol (NUVARING) 0.12-0.015 MG/24HR vaginal ring, , Disp: , Rfl:  .  Hyoscyamine Sulfate SL 0.125 MG SUBL, Place under the tongue., Disp: , Rfl:  .  ondansetron (ZOFRAN-ODT) 4 MG disintegrating tablet, Take by mouth., Disp: , Rfl:  .  pantoprazole (PROTONIX) 40 MG tablet, Take by mouth., Disp: , Rfl:  .  predniSONE (DELTASONE) 10 MG tablet, Take four tabs daily X 2 days, then two tabs daily X 2 days, then one tab daily X 2 days, Disp: 14 tablet, Rfl: 0 .  promethazine (PHENERGAN) 25 MG tablet, TAKE 1/2 TO 1 TABLET BY MOUTH UP TO THREE TIMES DAILY AS NEEDED FOR NAUSEA, Disp: , Rfl:    Family History  Problem Relation Age of Onset  . Lupus Maternal Grandmother   . Diabetes  Maternal Grandmother   . Heart disease Maternal Grandmother   . Hypertension Maternal Grandmother   . COPD Maternal Grandmother   . Diabetes Maternal Grandfather   . Heart disease Maternal Grandfather   . Hypertension Maternal Grandfather   . Cancer Neg Hx   . Stroke Neg Hx      Social History   Tobacco Use  . Smoking status: Never Smoker  . Smokeless tobacco: Never Used  Vaping Use  . Vaping Use: Never used  Substance Use Topics  . Alcohol use: No    Alcohol/week: 0.0 standard drinks  . Drug use: No    Allergies as of 12/05/2020 - Review Complete 12/05/2020  Allergen Reaction Noted  . Pineapple  01/21/2020    Review of Systems:    All systems reviewed and negative except where noted in HPI.   Physical Exam:  BP 139/79 (BP Location: Left Arm, Patient Position: Sitting, Cuff Size: Normal)   Pulse (!) 101   Temp 98.1 F (36.7 C) (Oral)   Ht 5\' 4"  (1.626 m)   Wt 139 lb (63 kg)   BMI 23.86 kg/m  No LMP recorded.  General:   Alert,  Well-developed, well-nourished, pleasant and cooperative in NAD Head:  Normocephalic and atraumatic. Eyes:  Sclera clear, no icterus.   Conjunctiva pink. Ears:  Normal auditory acuity. Nose:  No deformity, discharge, or lesions. Mouth:  No deformity or lesions,oropharynx pink & moist. Neck:  Supple; no masses or thyromegaly. Lungs:  Respirations even and unlabored.  Clear throughout to auscultation.   No wheezes, crackles, or rhonchi. No acute distress. Heart:  Regular rate and rhythm; no murmurs, clicks, rubs, or gallops. Abdomen:  Normal bowel sounds. Soft, non-tender and severely distended, tympanic to percussion without masses, hepatosplenomegaly or hernias noted.  No guarding or rebound tenderness.   Rectal: Not performed Msk:  Symmetrical without gross deformities. Good, equal movement & strength bilaterally. Pulses:  Normal pulses noted. Extremities:  No clubbing or edema.  No cyanosis. Neurologic:  Alert and oriented x3;   grossly normal neurologically. Skin:  Intact without significant lesions or rashes. No jaundice. Psych:  Alert and cooperative. Normal mood and affect.  Imaging Studies: None  Assessment and Plan:   Amber Gibson is a 20 y.o. African-American female with 4 months history of nonbloody diarrhea associated with epigastric pain, nausea, vomiting, abdominal cramps.  Currently being treated with a short course of prednisone for probable Crohn's ileitis. Unclear if infection was ruled out, although patient reported stool studies were performed by her PCP My recommendations are To discontinue prednisone since it is providing modest relief of symptoms only Check fecal calprotectin levels Recheck stool studies to rule out infection Recheck labs including CBC, CMP, CRP, iron panel, B12 and folate levels Discussed about strict lactose-free diet for 1 month Recommend video capsule endoscopy to evaluate for any small bowel lesions Okay to continue short acting hyoscyamine 3-4 times daily before meals and at bedtime Increase Protonix to 40 mg twice daily   Follow up in 2 to 3  months   Cephas Darby, MD

## 2020-12-06 ENCOUNTER — Encounter: Payer: Self-pay | Admitting: Gastroenterology

## 2020-12-06 ENCOUNTER — Encounter: Payer: Self-pay | Admitting: Family

## 2020-12-06 LAB — TISSUE TRANSGLUTAMINASE, IGA: Transglutaminase IgA: <2 U/mL (ref 0–3)

## 2020-12-06 LAB — COMPREHENSIVE METABOLIC PANEL
ALT: 17 IU/L (ref 0–32)
AST: 13 IU/L (ref 0–40)
Albumin/Globulin Ratio: 1.6 (ref 1.2–2.2)
Albumin: 4.7 g/dL (ref 3.9–5.0)
Alkaline Phosphatase: 50 IU/L (ref 44–121)
BUN/Creatinine Ratio: 23 (ref 9–23)
BUN: 15 mg/dL (ref 6–20)
Bilirubin Total: <0.2 mg/dL (ref 0.0–1.2)
CO2: 18 mmol/L — ABNORMAL LOW (ref 20–29)
Calcium: 9.4 mg/dL (ref 8.7–10.2)
Chloride: 105 mmol/L (ref 96–106)
Creatinine, Ser: 0.66 mg/dL (ref 0.57–1.00)
Globulin, Total: 2.9 g/dL (ref 1.5–4.5)
Glucose: 99 mg/dL (ref 65–99)
Potassium: 4.2 mmol/L (ref 3.5–5.2)
Sodium: 138 mmol/L (ref 134–144)
Total Protein: 7.6 g/dL (ref 6.0–8.5)
eGFR: 128 mL/min/{1.73_m2} (ref >59)

## 2020-12-06 LAB — B12 AND FOLATE PANEL
Folate: 5.2 ng/mL (ref >3.0)
Vitamin B-12: 668 pg/mL (ref 232–1245)

## 2020-12-06 LAB — C-REACTIVE PROTEIN: CRP: 5 mg/L (ref 0–10)

## 2020-12-06 LAB — IRON,TIBC AND FERRITIN PANEL
Ferritin: 79 ng/mL (ref 15–150)
Iron Saturation: 9 % — CL (ref 15–55)
Iron: 40 ug/dL (ref 27–159)
Total Iron Binding Capacity: 423 ug/dL (ref 250–450)
UIBC: 383 ug/dL (ref 131–425)

## 2020-12-06 LAB — CBC
Hematocrit: 36.2 % (ref 34.0–46.6)
Hemoglobin: 12.5 g/dL (ref 11.1–15.9)
MCH: 27.7 pg (ref 26.6–33.0)
MCHC: 34.5 g/dL (ref 31.5–35.7)
MCV: 80 fL (ref 79–97)
Platelets: 326 10*3/uL (ref 150–450)
RBC: 4.51 x10E6/uL (ref 3.77–5.28)
RDW: 13.5 % (ref 11.7–15.4)
WBC: 10.1 10*3/uL (ref 3.4–10.8)

## 2020-12-06 LAB — IGA: IgA/Immunoglobulin A, Serum: 274 mg/dL (ref 87–352)

## 2020-12-06 LAB — TSH: TSH: 0.509 u[IU]/mL (ref 0.450–4.500)

## 2020-12-08 LAB — GI PROFILE, STOOL, PCR

## 2020-12-08 LAB — CALPROTECTIN, FECAL: Calprotectin, Fecal: 127 ug/g — ABNORMAL HIGH (ref 0–120)

## 2020-12-12 ENCOUNTER — Encounter: Payer: Self-pay | Admitting: Gastroenterology

## 2020-12-13 ENCOUNTER — Other Ambulatory Visit: Payer: Self-pay | Admitting: Gastroenterology

## 2020-12-13 DIAGNOSIS — R195 Other fecal abnormalities: Secondary | ICD-10-CM

## 2020-12-13 DIAGNOSIS — K529 Noninfective gastroenteritis and colitis, unspecified: Secondary | ICD-10-CM

## 2020-12-13 MED ORDER — MESALAMINE ER 0.375 G PO CP24: 750.0000 mg | ORAL_CAPSULE | Freq: Two times a day (BID) | ORAL | 0 refills | Status: DC

## 2020-12-13 MED ORDER — MESALAMINE ER 0.375 G PO CP24: ORAL_CAPSULE | ORAL | 1 refills | Status: DC

## 2020-12-13 NOTE — Telephone Encounter (Signed)
Per Dr. Chauncey Mann try Apriso 0.75 mg 2 pills twice daily   Called in 4 tablets twice a day

## 2020-12-21 ENCOUNTER — Encounter: Payer: Self-pay | Admitting: Registered Nurse

## 2020-12-21 ENCOUNTER — Ambulatory Visit
Admission: RE | Admit: 2020-12-21 | Discharge: 2020-12-21 | Disposition: A | Discharge status: Home / Self Care | Payer: BC Managed Care – PPO | Attending: Gastroenterology | Admitting: Gastroenterology

## 2020-12-21 ENCOUNTER — Encounter
Admission: RE | Disposition: A | Discharge status: Home / Self Care | Payer: Self-pay | Source: Home / Self Care | Attending: Gastroenterology

## 2020-12-21 DIAGNOSIS — K529 Noninfective gastroenteritis and colitis, unspecified: Secondary | ICD-10-CM | POA: Diagnosis present

## 2020-12-21 DIAGNOSIS — K5000 Crohn's disease of small intestine without complications: Secondary | ICD-10-CM | POA: Insufficient documentation

## 2020-12-21 DIAGNOSIS — Z79899 Other long term (current) drug therapy: Secondary | ICD-10-CM | POA: Diagnosis not present

## 2020-12-21 DIAGNOSIS — R195 Other fecal abnormalities: Secondary | ICD-10-CM | POA: Insufficient documentation

## 2020-12-21 HISTORY — PX: GIVENS CAPSULE STUDY: SHX5432

## 2020-12-21 SURGERY — IMAGING PROCEDURE, GI TRACT, INTRALUMINAL, VIA CAPSULE

## 2020-12-22 ENCOUNTER — Encounter: Payer: Self-pay | Admitting: Gastroenterology

## 2020-12-26 ENCOUNTER — Other Ambulatory Visit: Payer: Self-pay

## 2020-12-26 ENCOUNTER — Ambulatory Visit
Admission: RE | Admit: 2020-12-26 | Discharge: 2020-12-26 | Disposition: A | Discharge status: Home / Self Care | Payer: BC Managed Care – PPO | Source: Ambulatory Visit | Attending: Surgery | Admitting: Surgery

## 2020-12-26 DIAGNOSIS — R2231 Localized swelling, mass and lump, right upper limb: Secondary | ICD-10-CM

## 2020-12-29 ENCOUNTER — Telehealth: Payer: Self-pay | Admitting: Gastroenterology

## 2020-12-29 DIAGNOSIS — K5000 Crohn's disease of small intestine without complications: Secondary | ICD-10-CM

## 2020-12-29 DIAGNOSIS — R195 Other fecal abnormalities: Secondary | ICD-10-CM

## 2020-12-29 DIAGNOSIS — K529 Noninfective gastroenteritis and colitis, unspecified: Secondary | ICD-10-CM

## 2020-12-29 MED ORDER — MESALAMINE ER 0.375 G PO CP24: 750.0000 mg | ORAL_CAPSULE | Freq: Two times a day (BID) | ORAL | 3 refills | Status: DC

## 2020-12-29 MED ORDER — BUDESONIDE 3 MG PO CPEP: 9.0000 mg | ORAL_CAPSULE | Freq: Every day | ORAL | 1 refills | Status: DC

## 2020-12-29 NOTE — Telephone Encounter (Signed)
Discussed video capsule endoscopy results with patient.  Capsule study was incomplete as capsule was retained in stomach for 3 hours 44 minutes.  Images were not captured beyond 8-hour time period.  Within the limited study, there were several aphthous ulcers scattered throughout the distal small bowel.  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  There is no history of NSAID use  Patient reports that she is responding to Apriso, feels 50% better However she does have episodes of vomiting and takes Phenergan as needed, but less often   Recommend to continue Apriso 0.75 mg twice daily Recommend to start budesonide 3 mg 3 capsules daily for 1 month given partial improvement in her symptoms Recheck fecal calprotectin levels in 3 months Will repeat video capsule endoscopy in 6 months Patient expressed understanding of the plan   [name], MD [address]  [address]  [place], [place] [address]  Main: [phone]  Fax: [phone] Pager: [phone]

## 2021-01-20 ENCOUNTER — Other Ambulatory Visit: Payer: Self-pay | Admitting: Gastroenterology

## 2021-01-20 DIAGNOSIS — K5000 Crohn's disease of small intestine without complications: Secondary | ICD-10-CM

## 2021-02-06 ENCOUNTER — Ambulatory Visit: Payer: Medicaid Other | Admitting: Gastroenterology

## 2021-02-08 ENCOUNTER — Ambulatory Visit (INDEPENDENT_AMBULATORY_CARE_PROVIDER_SITE_OTHER): Payer: BC Managed Care – PPO | Admitting: Gastroenterology

## 2021-02-08 ENCOUNTER — Encounter: Payer: Self-pay | Admitting: Gastroenterology

## 2021-02-08 ENCOUNTER — Other Ambulatory Visit: Payer: Self-pay

## 2021-02-08 VITALS — BP 137/88 | HR 96 | Temp 98.2°F | Ht 64.0 in | Wt 134.1 lb

## 2021-02-08 DIAGNOSIS — K5000 Crohn's disease of small intestine without complications: Secondary | ICD-10-CM

## 2021-02-08 NOTE — Progress Notes (Signed)
Amber Repress, MD [address]  [address]  [place], [place] [address]  Main: [phone]  Fax: [phone]    Gastroenterology Consultation  Referring Provider:     Miki Kins, FNP Primary Care Physician:  Miki Kins, FNP Primary Gastroenterologist:  Dr. Mia Creek Reason for Consultation:   Small bowel Crohn's        HPI:   Amber Gibson is a 20 y.o. female referred by Dr. Talbert Forest, Maryanna Shape, FNP  for consultation & management of chronic diarrhea and epigastric pain.  Patient has been experiencing frequent episodes of nonbloody diarrhea and occasionally with mixed with streaks of blood associated with abdominal cramps, epigastric pain associated with nausea and vomiting.  Patient reports that her symptoms started end of December, beginning of January.  She was originally seen by Dr. Vira Agar it [facility] gastroenterology office.  She underwent upper endoscopy and colonoscopy which revealed scant, scattered aphthous ulcers in the terminal ileum.  Pathology results revealed acute ileitis only with no evidence of chronicity.  I do not have the official pathology results with me to review today.  She brought a copy of her upper endoscopy and colonoscopy reports with her.  Prior to her endoscopy evaluation, patient was on short course of prednisone with short course of Flagyl by her PCP as her ASCA antibodies came back positive, presuming that she has Crohn's disease.  Based on the pathology results and her symptoms, she was started on short course of prednisone by Dr. Mia Creek due to high index of clinical suspicion for Crohn's ileitis.  Patient reports that the steroids did not provide any significant relief of her symptoms.  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  She has been gradually losing weight but not at her prepregnancy weight.  She has a 13 months old daughter at home.  She denied any GI symptoms during her pregnancy.  She does report  significant abdominal bloating as well.  She is taking Protonix once a day as well as hyoscyamine 0.125 mg as needed.  Although patient denies stress in her life, her mom thinks she is stressful working as well as taking care of her daughter Patient loves to eat cheese pizza and cheese hamburger almost every other day Patient has significant leukocytosis with normocytic anemia in 08/2019.  Most recent labs from January 2022 at her PCPs office revealed normal CBC, CMP  Patient wanted to transfer her care for management of her GI symptoms  She does not smoke tobacco, denies alcohol use, denies marijuana use or IV drug abuse  Follow-up visit 02/08/2021: Patient is here for follow-up of chronic bloody diarrhea and epigastric pain.  She underwent video capsule endoscopy, found to have several aphthous ulcers scattered throughout the distal small bowel.  Therefore, given that she also has elevated fecal calprotectin levels and no infectious etiology found on stool studies, I have started her on Entocort 9 mg daily along with Apriso 0.75 mg twice daily.  Patient reports that the episodes of vomiting and diarrhea have almost resolved.  She does experience intermittent epigastric pain, mild to moderate in intensity about 1-2 times a week with no particular relation to food.  Her weight has been stable.  She does have abdominal bloating  NSAIDs: None  Antiplts/Anticoagulants/Anti thrombotics: None  GI Procedures: EGD and colonoscopy 11/24/2020 EGD normal, gastric biopsies were performed Colonoscopy revealed normal colon, aphthous ulcers in the terminal ileum, biopsies performed Video capsule endoscopy 12/21/2020 Capsule study was  incomplete as capsule was retained in stomach for 3 hours 44 minutes.  Images were not captured beyond 8-hour time period.  Within the limited study, there were several aphthous ulcers scattered throughout the distal small bowel.  These features are consistent with mild small bowel  Crohn's.  There is no history of NSAID use   Past Medical History:  Diagnosis Date   Menometrorrhagia    Orthostatic proteinuria    Vitamin D deficiency     Past Surgical History:  Procedure Laterality Date   GIVENS CAPSULE STUDY N/A 12/21/2020   Procedure: GIVENS CAPSULE STUDY;  Surgeon: Toney Reil, MD;  Location: [facility] ENDOSCOPY;  Service: Gastroenterology;  Laterality: N/A;   NO PAST SURGERIES      Current Outpatient Medications:    budesonide (ENTOCORT EC) 3 MG 24 hr capsule, TAKE 3 CAPSULES (9 MG TOTAL) BY MOUTH DAILY., Disp: 90 capsule, Rfl: 0   etonogestrel-ethinyl estradiol (NUVARING) 0.12-0.015 MG/24HR vaginal ring, , Disp: , Rfl:    mesalamine (APRISO) 0.375 g 24 hr capsule, Take 2 capsules (0.75 g total) by mouth in the morning and at bedtime., Disp: 120 capsule, Rfl: 0   promethazine (PHENERGAN) 25 MG tablet, TAKE 1/2 TO 1 TABLET BY MOUTH UP TO THREE TIMES DAILY AS NEEDED FOR NAUSEA, Disp: , Rfl:    Hyoscyamine Sulfate SL 0.125 MG SUBL, Place under the tongue. (Patient not taking: Reported on 02/08/2021), Disp: , Rfl:    mesalamine (APRISO) 0.375 g 24 hr capsule, Take 2 capsules (0.75 g total) by mouth in the morning and at bedtime. (Patient not taking: Reported on 02/08/2021), Disp: 120 capsule, Rfl: 3   ondansetron (ZOFRAN-ODT) 4 MG disintegrating tablet, Take by mouth. (Patient not taking: Reported on 02/08/2021), Disp: , Rfl:    pantoprazole (PROTONIX) 40 MG tablet, Take by mouth. (Patient not taking: Reported on 02/08/2021), Disp: , Rfl:    Family History  Problem Relation Age of Onset   Lupus Maternal Grandmother    Diabetes Maternal Grandmother    Heart disease Maternal Grandmother    Hypertension Maternal Grandmother    COPD Maternal Grandmother    Diabetes Maternal Grandfather    Heart disease Maternal Grandfather    Hypertension Maternal Grandfather    Cancer Neg Hx    Stroke Neg Hx      Social History   Tobacco Use   Smoking status: Never    Smokeless tobacco: Never  Vaping Use   Vaping Use: Never used  Substance Use Topics   Alcohol use: No    Alcohol/week: 0.0 standard drinks   Drug use: No    Allergies as of 02/08/2021 - Review Complete 02/08/2021  Allergen Reaction Noted   Pineapple  01/21/2020    Review of Systems:    All systems reviewed and negative except where noted in HPI.   Physical Exam:  BP 137/88 (BP Location: Left Arm, Patient Position: Sitting, Cuff Size: Normal)   Pulse 96   Temp 98.2 F (36.8 C) (Oral)   Ht 5\' 4"  (1.626 m)   Wt 134 lb 2 oz (60.8 kg)   BMI 23.02 kg/m  No LMP recorded.  General:   Alert,  Well-developed, well-nourished, pleasant and cooperative in NAD Head:  Normocephalic and atraumatic. Eyes:  Sclera clear, no icterus.   Conjunctiva pink. Ears:  Normal auditory acuity. Nose:  No deformity, discharge, or lesions. Mouth:  No deformity or lesions,oropharynx pink & moist. Neck:  Supple; no masses or thyromegaly. Lungs:  Respirations even and unlabored.  Clear throughout to auscultation.   No wheezes, crackles, or rhonchi. No acute distress. Heart:  Regular rate and rhythm; no murmurs, clicks, rubs, or gallops. Abdomen:  Normal bowel sounds. Soft, non-tender and mildly distended, tympanic to percussion without masses, hepatosplenomegaly or hernias noted.  No guarding or rebound tenderness.   Rectal: Not performed Msk:  Symmetrical without gross deformities. Good, equal movement & strength bilaterally. Pulses:  Normal pulses noted. Extremities:  No clubbing or edema.  No cyanosis. Neurologic:  Alert and oriented x3;  grossly normal neurologically. Skin:  Intact without significant lesions or rashes. No jaundice. Psych:  Alert and cooperative. Normal mood and affect.  Imaging Studies: None  Assessment and Plan:   Amber Gibson is a 20 y.o. African-American female with 4 months history of nonbloody diarrhea associated with epigastric pain, nausea, vomiting, abdominal  cramps.  EGD and colonoscopy were unremarkable.  Elevated fecal calprotectin levels.  GI profile PCR negative for infection.  Video capsule study was incomplete, however revealed scattered aphthous ulcers in distal small bowel.  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

## 2021-02-26 ENCOUNTER — Encounter: Payer: Self-pay | Admitting: Gastroenterology

## 2021-02-27 ENCOUNTER — Ambulatory Visit
Admission: EM | Admit: 2021-02-27 | Discharge: 2021-02-27 | Disposition: A | Discharge status: Home / Self Care | Payer: BC Managed Care – PPO | Attending: Physician Assistant | Admitting: Physician Assistant

## 2021-02-27 ENCOUNTER — Encounter: Payer: Self-pay | Admitting: Emergency Medicine

## 2021-02-27 ENCOUNTER — Other Ambulatory Visit: Payer: Self-pay | Admitting: Gastroenterology

## 2021-02-27 ENCOUNTER — Other Ambulatory Visit: Payer: Self-pay

## 2021-02-27 DIAGNOSIS — K5090 Crohn's disease, unspecified, without complications: Secondary | ICD-10-CM | POA: Insufficient documentation

## 2021-02-27 DIAGNOSIS — E860 Dehydration: Secondary | ICD-10-CM | POA: Insufficient documentation

## 2021-02-27 DIAGNOSIS — R197 Diarrhea, unspecified: Secondary | ICD-10-CM | POA: Insufficient documentation

## 2021-02-27 DIAGNOSIS — R112 Nausea with vomiting, unspecified: Secondary | ICD-10-CM | POA: Insufficient documentation

## 2021-02-27 DIAGNOSIS — K5000 Crohn's disease of small intestine without complications: Secondary | ICD-10-CM

## 2021-02-27 HISTORY — DX: Crohn's disease, unspecified, without complications: K50.90

## 2021-02-27 LAB — CBC WITH DIFFERENTIAL/PLATELET
Abs Immature Granulocytes: 0.02 10*3/uL (ref 0.00–0.07)
Basophils Absolute: 0 10*3/uL (ref 0.0–0.1)
Basophils Relative: 1 %
Eosinophils Absolute: 0 10*3/uL (ref 0.0–0.5)
Eosinophils Relative: 1 %
HCT: 42.2 % (ref 36.0–46.0)
Hemoglobin: 14.3 g/dL (ref 12.0–15.0)
Immature Granulocytes: 0 %
Lymphocytes Relative: 21 %
Lymphs Abs: 1.7 10*3/uL (ref 0.7–4.0)
MCH: 27.6 pg (ref 26.0–34.0)
MCHC: 33.9 g/dL (ref 30.0–36.0)
MCV: 81.5 fL (ref 80.0–100.0)
Monocytes Absolute: 0.6 10*3/uL (ref 0.1–1.0)
Monocytes Relative: 8 %
Neutro Abs: 6 10*3/uL (ref 1.7–7.7)
Neutrophils Relative %: 69 %
Platelets: 378 10*3/uL (ref 150–400)
RBC: 5.18 MIL/uL — ABNORMAL HIGH (ref 3.87–5.11)
RDW: 13.3 % (ref 11.5–15.5)
WBC: 8.5 10*3/uL (ref 4.0–10.5)
nRBC: 0 % (ref 0.0–0.2)

## 2021-02-27 LAB — COMPREHENSIVE METABOLIC PANEL
ALT: 44 U/L (ref 0–44)
AST: 33 U/L (ref 15–41)
Albumin: 4.9 g/dL (ref 3.5–5.0)
Alkaline Phosphatase: 49 U/L (ref 38–126)
Anion gap: 13 (ref 5–15)
BUN: 22 mg/dL — ABNORMAL HIGH (ref 6–20)
CO2: 23 mmol/L (ref 22–32)
Calcium: 9.7 mg/dL (ref 8.9–10.3)
Chloride: 103 mmol/L (ref 98–111)
Creatinine, Ser: 0.73 mg/dL (ref 0.44–1.00)
GFR, Estimated: >60 mL/min (ref >60)
Glucose, Bld: 76 mg/dL (ref 70–99)
Potassium: 3.5 mmol/L (ref 3.5–5.1)
Sodium: 139 mmol/L (ref 135–145)
Total Bilirubin: 0.9 mg/dL (ref 0.3–1.2)
Total Protein: 9.5 g/dL — ABNORMAL HIGH (ref 6.5–8.1)

## 2021-02-27 LAB — URINALYSIS, COMPLETE (UACMP) WITH MICROSCOPIC
Glucose, UA: NEGATIVE mg/dL
Ketones, ur: >160 mg/dL — AB
Leukocytes,Ua: NEGATIVE
Nitrite: NEGATIVE
Protein, ur: 100 mg/dL — AB
Specific Gravity, Urine: >1.03 — ABNORMAL HIGH (ref 1.005–1.030)
pH: 5.5 (ref 5.0–8.0)

## 2021-02-27 LAB — PREGNANCY, URINE: Preg Test, Ur: NEGATIVE

## 2021-02-27 MED ORDER — ONDANSETRON HCL 4 MG/2ML IJ SOLN
8.0000 mg | Freq: Once | INTRAMUSCULAR | Status: AC
  Administered 2021-02-27: 8 mg via INTRAVENOUS

## 2021-02-27 MED ORDER — SODIUM CHLORIDE 0.9 % IV BOLUS
1000.0000 mL | Freq: Once | INTRAVENOUS | Status: AC
  Administered 2021-02-27: 1000 mL via INTRAVENOUS

## 2021-02-27 MED ORDER — ONDANSETRON 8 MG PO TBDP: 8.0000 mg | ORAL_TABLET | Freq: Three times a day (TID) | ORAL | 0 refills | Status: AC | PRN

## 2021-02-27 NOTE — Discharge Instructions (Addendum)
You have been given IV fluids today since you are dehydrated.  You were also given nausea medication.  I have sent that medication into the pharmacy for you as well.  This medication will dissolve under your tongue.  You should start drinking some Pedialyte 10 plenty of water.  Stick to bland diet.  See the handout I provided.  Contact your GI specialist tomorrow.  They might want to restart you on steroids, but I am not sure and I would like them to make that decision.  For now, you can take Tylenol for pain and the nausea medication and try to rest and stay hydrated.  If you have any severe acute pain, fever or are still having intractable vomiting with Zofran, please go to the emergency department.  If you notice black or bloody stools, you should go to the ED.  I have given you a work note for the next couple of days.

## 2021-02-27 NOTE — ED Provider Notes (Signed)
[facility]    CSN: [id number] Arrival date & time: 02/27/21  1345      History   Chief Complaint Chief Complaint  Patient presents with   Abdominal Pain   Emesis   Diarrhea    HPI Amber Gibson is a 20 y.o. female presenting for umbilical abdominal pain for the past 5 days.  Patient also states that she has had no appetite and has been unable to eat or drink due to nausea/vomiting/diarrhea for the same amount of time.  Patient denies any dark stools or blood in stool.  Patient says she has had 2 episodes of vomiting to count today.  She says that she had 16 episodes of vomiting yesterday.  Patient says that she has been unable to take her medications due to vomiting.  Patient diagnosed with Crohn's disease about 3 to 4 months ago.  Patient previously on budesonide but to taper off of it 2 days before her symptoms started.  Patient does take mesalamine but has unable to keep that down.  She states that whenever she got down to 3 mg of budesonide a day her symptoms started to worsen.  Patient is managed by [facility] in [place].  Her next appointment is in September 2022.  Patient says that she went to a different GI specialist yesterday regarding her symptoms but did not get back in today's all day.  She believes she will be able to contact them tomorrow morning.  Patient denies any fever.  States that she has felt very dehydrated and earlier today she felt like her vision was going black when she was in a pass out.  She denies loss of consciousness.  No urinary symptoms.  No other complaints or concerns.  HPI  Past Medical History:  Diagnosis Date   Crohn disease (HCC)    Menometrorrhagia    Orthostatic proteinuria    Vitamin D deficiency     Patient Active Problem List   Diagnosis Date Noted   Urticaria 01/21/2020   Acne 01/21/2020   SVD (spontaneous vaginal delivery) 09/12/2019   Postpartum care following vaginal delivery 09/12/2019   Indication for  care in labor and delivery, antepartum 09/09/2019   Thalassemic syndrome 09/07/2019   Supervision of normal first pregnancy, antepartum 03/04/2019   Menometrorrhagia 07/14/2015    Past Surgical History:  Procedure Laterality Date   GIVENS CAPSULE STUDY N/A 12/21/2020   Procedure: GIVENS CAPSULE STUDY;  Surgeon: Toney Reil, MD;  Location: [facility] ENDOSCOPY;  Service: Gastroenterology;  Laterality: N/A;   NO PAST SURGERIES      OB History     Gravida  1   Para  1   Term  0   Preterm  1   AB  0   Living  1      SAB  0   IAB  0   Ectopic  0   Multiple  0   Live Births  1            Home Medications    Prior to Admission medications   Medication Sig Start Date End Date Taking? Authorizing Provider  ondansetron (ZOFRAN ODT) 8 MG disintegrating tablet Take 1 tablet (8 mg total) by mouth every 8 (eight) hours as needed for up to 7 days for nausea or vomiting. 02/27/21 03/06/21 Yes Shirlee Latch, PA-C  etonogestrel-ethinyl estradiol (NUVARING) 0.12-0.015 MG/24HR vaginal ring  12/24/19   [provider]  Hyoscyamine Sulfate SL 0.125 MG SUBL Place under  the tongue. Patient not taking: No sig reported 11/21/20   [provider]  mesalamine (APRISO) 0.375 g 24 hr capsule Take 2 capsules (0.75 g total) by mouth in the morning and at bedtime. 12/13/20 02/08/21  Toney Reil, MD  mesalamine (APRISO) 0.375 g 24 hr capsule Take 2 capsules (0.75 g total) by mouth in the morning and at bedtime. Patient not taking: Reported on 02/08/2021 12/29/20 01/28/21  Toney Reil, MD  pantoprazole (PROTONIX) 40 MG tablet Take by mouth. 11/21/20 11/21/21  [provider]  promethazine (PHENERGAN) 25 MG tablet TAKE 1/2 TO 1 TABLET BY MOUTH UP TO THREE TIMES DAILY AS NEEDED FOR NAUSEA 11/09/20   [provider]    Family History Family History  Problem Relation Age of Onset   Lupus Maternal Grandmother    Diabetes Maternal Grandmother    Heart  disease Maternal Grandmother    Hypertension Maternal Grandmother    COPD Maternal Grandmother    Diabetes Maternal Grandfather    Heart disease Maternal Grandfather    Hypertension Maternal Grandfather    Cancer Neg Hx    Stroke Neg Hx     Social History Social History   Tobacco Use   Smoking status: Never   Smokeless tobacco: Never  Vaping Use   Vaping Use: Never used  Substance Use Topics   Alcohol use: No    Alcohol/week: 0.0 standard drinks   Drug use: No     Allergies   Pineapple   Review of Systems Review of Systems  Constitutional:  Positive for appetite change and fatigue. Negative for chills and fever.  Respiratory:  Negative for shortness of breath.   Cardiovascular:  Negative for chest pain.  Gastrointestinal:  Positive for abdominal distention, abdominal pain, diarrhea, nausea and vomiting. Negative for blood in stool and constipation.  Genitourinary:  Positive for decreased urine volume. Negative for difficulty urinating, dysuria, flank pain and hematuria.  Musculoskeletal:  Negative for back pain.  Neurological:  Positive for weakness.    Physical Exam Triage Vital Signs ED Triage Vitals  Enc Vitals Group     BP 02/27/21 1425 124/80     Pulse Rate 02/27/21 1425 84     Resp 02/27/21 1425 18     Temp 02/27/21 1425 98.5 F (36.9 C)     Temp Source 02/27/21 1425 Oral     SpO2 02/27/21 1425 100 %     Weight --      Height --      Head Circumference --      Peak Flow --      Pain Score 02/27/21 1420 5     Pain Loc --      Pain Edu? --      Excl. in GC? --    No data found.  Updated Vital Signs BP 124/80 (BP Location: Left Arm)   Pulse 84   Temp 98.5 F (36.9 C) (Oral)   Resp 18   LMP 02/10/2021 (Exact Date) Comment: Nuvaring  SpO2 100%   Physical Exam Vitals and nursing note reviewed.  Constitutional:      General: She is not in acute distress.    Appearance: Normal appearance. She is well-developed. She is ill-appearing. She is not  toxic-appearing.  HENT:     Head: Normocephalic and atraumatic.     Nose: Nose normal.     Mouth/Throat:     Mouth: Mucous membranes are dry.     Pharynx: Oropharynx is clear.  Eyes:  General: No scleral icterus.       Right eye: No discharge.        Left eye: No discharge.     Conjunctiva/sclera: Conjunctivae normal.  Cardiovascular:     Rate and Rhythm: Normal rate and regular rhythm.     Heart sounds: Normal heart sounds.  Pulmonary:     Effort: Pulmonary effort is normal. No respiratory distress.     Breath sounds: Normal breath sounds.  Abdominal:     General: There is distension (mild).     Palpations: Abdomen is soft.     Tenderness: There is abdominal tenderness (mild/moderate TTP periumbilical). There is no right CVA tenderness, left CVA tenderness or guarding.  Musculoskeletal:     Cervical back: Neck supple.  Skin:    General: Skin is dry.  Neurological:     General: No focal deficit present.     Mental Status: She is alert. Mental status is at baseline.     Motor: No weakness.     Gait: Gait normal.  Psychiatric:        Mood and Affect: Mood normal.        Behavior: Behavior normal.        Thought Content: Thought content normal.     UC Treatments / Results  Labs (all labs ordered are listed, but only abnormal results are displayed) Labs Reviewed  URINALYSIS, COMPLETE (UACMP) WITH MICROSCOPIC - Abnormal; Notable for the following components:      Result Value   APPearance HAZY (*)    Specific Gravity, Urine >1.030 (*)    Hgb urine dipstick TRACE (*)    Bilirubin Urine MODERATE (*)    Ketones, ur >160 (*)    Protein, ur 100 (*)    Bacteria, UA FEW (*)    All other components within normal limits  CBC WITH DIFFERENTIAL/PLATELET - Abnormal; Notable for the following components:   RBC 5.18 (*)    All other components within normal limits  COMPREHENSIVE METABOLIC PANEL - Abnormal; Notable for the following components:   BUN 22 (*)    Total Protein  9.5 (*)    All other components within normal limits  PREGNANCY, URINE    EKG   Radiology No results found.  Procedures Procedures (including critical care time)  Medications Ordered in UC Medications  sodium chloride 0.9 % bolus 1,000 mL (1,000 mLs Intravenous New Bag/Given 02/27/21 1555)  ondansetron (ZOFRAN) injection 8 mg (8 mg Intravenous Given 02/27/21 1556)    Initial Impression / Assessment and Plan / UC Course  I have reviewed the triage vital signs and the nursing notes.  Pertinent labs & imaging results that were available during my care of the patient were reviewed by me and considered in my medical decision making (see chart for details).  20 y/o female presenting for umbilical abdominal pain, nausea/vomiting/diarrhea, fatigue and weakness.  Symptoms have been ongoing for nearly 5 days.  Patient has been unable to eat or drink due to her symptoms.  Has also not been able to take her mesalamine.  Patient has history of Crohn's disease diagnosed a few months ago and has recently been weaned off of budesonide.  Patient reports symptoms seem to worsen after weaning off the budesonide.  Patient is managed by [facility].  Patient's vital signs are stable.  She is ill-appearing but nontoxic.  Dry mucous membranes.  Tenderness to palpation of the periumbilical region without guarding or rebound.  Chest is clear to auscultation heart regular  rate and rhythm.  Urinalysis performed today shows greater than 1.030 specific gravity, trace blood, moderate bili, greater than 160 ketones, protein.  No evidence of UTI.  Given patient's urinalysis findings, dry mucous membranes and significant nausea/vomiting and diarrhea, advised IV fluids with Zofran 8 mg via IV and lab draw including CBC and CMP to assess for electrolyte disturbances.  Patient is agreeable.  CBC is unrevealing.  CMP shows BUN of 22.  Electrolytes are within normal limits.  Reviewed lab results with patient.  Patient  stated that after completing the IV fluids and Zofran she is feeling much better and denies any abdominal pain at this point.  Spoke with patient about having her continue with the Zofran but ODT and increase her rest and fluids and follow bland diet.  Advised her to follow-up with GI specialist via phone tomorrow morning.  Hopefully she can get a sooner appointment.  Advised patient she may need to be put back on corticosteroids but I do not want to make that decision since she was just recently weaned off of the medication.  Patient is understanding.  Reviewed ED precautions with patient advised to the ED for any severe abdominal pain, fever, dark or bloody stools, intractable vomiting, weakness, syncope.  Patient agrees to plan.   Final Clinical Impressions(s) / UC Diagnoses   Final diagnoses:  Nausea vomiting and diarrhea  Crohn's disease without complication, unspecified gastrointestinal tract location [facility])  Dehydration     Discharge Instructions      You have been given IV fluids today since you are dehydrated.  You were also given nausea medication.  I have sent that medication into the pharmacy for you as well.  This medication will dissolve under your tongue.  You should start drinking some Pedialyte 10 plenty of water.  Stick to bland diet.  See the handout I provided.  Contact your GI specialist tomorrow.  They might want to restart you on steroids, but I am not sure and I would like them to make that decision.  For now, you can take Tylenol for pain and the nausea medication and try to rest and stay hydrated.  If you have any severe acute pain, fever or are still having intractable vomiting with Zofran, please go to the emergency department.  If you notice black or bloody stools, you should go to the ED.  I have given you a work note for the next couple of days.     ED Prescriptions     Medication Sig Dispense Auth. Provider   ondansetron (ZOFRAN ODT) 8 MG disintegrating tablet  Take 1 tablet (8 mg total) by mouth every 8 (eight) hours as needed for up to 7 days for nausea or vomiting. 20 tablet Gareth Morgan      PDMP not reviewed this encounter.   Shirlee Latch, PA-C 02/27/21 1658

## 2021-02-27 NOTE — ED Triage Notes (Signed)
Pt presents today with c/o of mid abdominal pain with n/v/d that began x 3 days. She does report being newly diagnosed with Crohns disease in March, 2022 and has been unable to keep anti-nausea medications down.

## 2021-03-15 ENCOUNTER — Other Ambulatory Visit: Payer: Self-pay | Admitting: Gastroenterology

## 2021-03-15 DIAGNOSIS — K5000 Crohn's disease of small intestine without complications: Secondary | ICD-10-CM

## 2021-03-31 LAB — CALPROTECTIN, FECAL: Calprotectin, Fecal: 254 ug/g — ABNORMAL HIGH (ref 0–120)

## 2021-04-04 ENCOUNTER — Encounter: Payer: Self-pay | Admitting: Gastroenterology

## 2021-04-13 ENCOUNTER — Other Ambulatory Visit: Payer: Self-pay | Admitting: Gastroenterology

## 2021-04-13 DIAGNOSIS — K529 Noninfective gastroenteritis and colitis, unspecified: Secondary | ICD-10-CM

## 2021-04-13 DIAGNOSIS — R195 Other fecal abnormalities: Secondary | ICD-10-CM

## 2021-04-13 DIAGNOSIS — K5000 Crohn's disease of small intestine without complications: Secondary | ICD-10-CM

## 2021-04-13 MED ORDER — MESALAMINE ER 0.375 G PO CP24: 750.0000 mg | ORAL_CAPSULE | Freq: Every day | ORAL | 1 refills | Status: DC

## 2021-05-15 ENCOUNTER — Telehealth: Payer: Self-pay

## 2021-05-15 ENCOUNTER — Encounter: Payer: Self-pay | Admitting: Gastroenterology

## 2021-05-15 ENCOUNTER — Other Ambulatory Visit: Payer: Self-pay

## 2021-05-15 ENCOUNTER — Ambulatory Visit (INDEPENDENT_AMBULATORY_CARE_PROVIDER_SITE_OTHER): Payer: BC Managed Care – PPO | Admitting: Gastroenterology

## 2021-05-15 VITALS — BP 119/73 | HR 82 | Temp 99.1°F | Ht 64.0 in | Wt 128.0 lb

## 2021-05-15 DIAGNOSIS — K5000 Crohn's disease of small intestine without complications: Secondary | ICD-10-CM | POA: Diagnosis not present

## 2021-05-15 MED ORDER — RIFAXIMIN 550 MG PO TABS: 550.0000 mg | ORAL_TABLET | Freq: Three times a day (TID) | ORAL | 0 refills | Status: AC

## 2021-05-15 MED ORDER — SHINGRIX 50 MCG/0.5ML IM SUSR: 0.5000 mL | Freq: Once | INTRAMUSCULAR | 0 refills | Status: AC

## 2021-05-15 NOTE — Progress Notes (Signed)
[name], MD [address]  [address]  [place], [place] [address]  Main: [phone]  Fax: [phone]    Gastroenterology Consultation  Referring Provider:     Miki Kins, FNP Primary Care Physician:  Miki Kins, FNP Primary Gastroenterologist:  Dr. Mia Creek Reason for Consultation:   Small bowel Crohn's        HPI:   Amber Gibson is a 20 y.o. female referred by Dr. Talbert Forest, Maryanna Shape, FNP  for consultation & management of chronic diarrhea and epigastric pain.  Patient has been experiencing frequent episodes of nonbloody diarrhea and occasionally with mixed with streaks of blood associated with abdominal cramps, epigastric pain associated with nausea and vomiting.  Patient reports that her symptoms started end of December, beginning of January.  She was originally seen by Dr. Vira Agar it [facility] clinic gastroenterology office.  She underwent upper endoscopy and colonoscopy which revealed scant, scattered aphthous ulcers in the terminal ileum.  Pathology results revealed acute ileitis only with no evidence of chronicity.  I do not have the official pathology results with me to review today.  She brought a copy of her upper endoscopy and colonoscopy reports with her.  Prior to her endoscopy evaluation, patient was on short course of prednisone with short course of Flagyl by her PCP as her ASCA antibodies came back positive, presuming that she has Crohn's disease.  Based on the pathology results and her symptoms, she was started on short course of prednisone by Dr. Mia Creek due to high index of clinical suspicion for Crohn's ileitis.  Patient reports that the steroids did not provide any significant relief of her symptoms.  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  She has been gradually losing weight but not at her prepregnancy weight.  She has a 78 months old daughter at home.  She denied any GI symptoms during her pregnancy.  She does report  significant abdominal bloating as well.  She is taking Protonix once a day as well as hyoscyamine 0.125 mg as needed.  Although patient denies stress in her life, her mom thinks she is stressful working as well as taking care of her daughter Patient loves to eat cheese pizza and cheese hamburger almost every other day Patient has significant leukocytosis with normocytic anemia in 08/2019.  Most recent labs from January 2022 at her PCPs office revealed normal CBC, CMP  Patient wanted to transfer her care for management of her GI symptoms  She does not smoke tobacco, denies alcohol use, denies marijuana use or IV drug abuse  Follow-up visit 02/08/2021: Patient is here for follow-up of chronic bloody diarrhea and epigastric pain.  She underwent video capsule endoscopy, found to have several aphthous ulcers scattered throughout the distal small bowel.  Therefore, given that she also has elevated fecal calprotectin levels and no infectious etiology found on stool studies, I have started her on Entocort 9 mg daily along with Apriso 0.75 mg twice daily.  Patient reports that the episodes of vomiting and diarrhea have almost resolved.  She does experience intermittent epigastric pain, mild to moderate in intensity about 1-2 times a week with no particular relation to food.  Her weight has been stable.  She does have abdominal bloating  Follow-up visit 05/15/2021 Amber Gibson is here for follow-up of small bowel Crohn's.  Her Crohn's disease is currently maintained on budesonide 6 mg p.o. daily.  Patient developed flareup upon tapering budesonide.  She went back to 9  mg daily which provided complete relief, she decreased to 6 mg daily.  Also, taking low-dose Apriso.  She is currently experiencing about twice weekly episodes of nausea and vomiting, intermittent diarrhea.  Her fecal calprotectin levels continue to rise.  Her weight is not stable.  Patient is accompanied by her mom today  NSAIDs:  None  Antiplts/Anticoagulants/Anti thrombotics: None  GI Procedures: EGD and colonoscopy 11/24/2020 EGD normal, gastric biopsies were performed Colonoscopy revealed normal colon, aphthous ulcers in the terminal ileum, biopsies performed Video capsule endoscopy 12/21/2020 Capsule study was incomplete as capsule was retained in stomach for 3 hours 44 minutes.  Images were not captured beyond 8-hour time period.  Within the limited study, there were several aphthous ulcers scattered throughout the distal small bowel.  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  There is no history of NSAID use   Past Medical History:  Diagnosis Date   Crohn disease (HCC)    Menometrorrhagia    Orthostatic proteinuria    Vitamin D deficiency     Past Surgical History:  Procedure Laterality Date   GIVENS CAPSULE STUDY N/A 12/21/2020   Procedure: GIVENS CAPSULE STUDY;  Surgeon: Toney Reil, MD;  Location: [facility] ENDOSCOPY;  Service: Gastroenterology;  Laterality: N/A;   NO PAST SURGERIES      Current Outpatient Medications:    etonogestrel-ethinyl estradiol (NUVARING) 0.12-0.015 MG/24HR vaginal ring, , Disp: , Rfl:    Hyoscyamine Sulfate SL 0.125 MG SUBL, Place under the tongue., Disp: , Rfl:    pantoprazole (PROTONIX) 40 MG tablet, Take by mouth., Disp: , Rfl:    promethazine (PHENERGAN) 25 MG tablet, TAKE 1/2 TO 1 TABLET BY MOUTH UP TO THREE TIMES DAILY AS NEEDED FOR NAUSEA, Disp: , Rfl:    rifaximin (XIFAXAN) 550 MG TABS tablet, Take 1 tablet (550 mg total) by mouth 3 (three) times daily for 14 days., Disp: 42 tablet, Rfl: 0   Zoster Vaccine Adjuvanted (SHINGRIX) injection, Inject 0.5 mLs into the muscle once for 1 dose., Disp: 0.5 mL, Rfl: 0   mesalamine (APRISO) 0.375 g 24 hr capsule, Take 2 capsules (0.75 g total) by mouth daily., Disp: 60 capsule, Rfl: 1   Family History  Problem Relation Age of Onset   Lupus Maternal Grandmother    Diabetes Maternal Grandmother    Heart  disease Maternal Grandmother    Hypertension Maternal Grandmother    COPD Maternal Grandmother    Diabetes Maternal Grandfather    Heart disease Maternal Grandfather    Hypertension Maternal Grandfather    Cancer Neg Hx    Stroke Neg Hx      Social History   Tobacco Use   Smoking status: Never   Smokeless tobacco: Never  Vaping Use   Vaping Use: Never used  Substance Use Topics   Alcohol use: No    Alcohol/week: 0.0 standard drinks   Drug use: No    Allergies as of 05/15/2021 - Review Complete 05/15/2021  Allergen Reaction Noted   Pineapple  01/21/2020    Review of Systems:    All systems reviewed and negative except where noted in HPI.   Physical Exam:  BP 119/73 (BP Location: Right Arm, Patient Position: Sitting, Cuff Size: Normal)   Pulse 82   Temp 99.1 F (37.3 C) (Oral)   Ht 5\' 4"  (1.626 m)   Wt 128 lb (58.1 kg)   BMI 21.97 kg/m  No LMP recorded. (Menstrual status: Other).  General:   Alert,  Well-developed, well-nourished,  pleasant and cooperative in NAD Head:  Normocephalic and atraumatic. Eyes:  Sclera clear, no icterus.   Conjunctiva pink. Ears:  Normal auditory acuity. Nose:  No deformity, discharge, or lesions. Mouth:  No deformity or lesions,oropharynx pink & moist. Neck:  Supple; no masses or thyromegaly. Lungs:  Respirations even and unlabored.  Clear throughout to auscultation.   No wheezes, crackles, or rhonchi. No acute distress. Heart:  Regular rate and rhythm; no murmurs, clicks, rubs, or gallops. Abdomen:  Normal bowel sounds. Soft, non-tender and mildly distended, tympanic to percussion without masses, hepatosplenomegaly or hernias noted.  No guarding or rebound tenderness.   Rectal: Not performed Msk:  Symmetrical without gross deformities. Good, equal movement & strength bilaterally. Pulses:  Normal pulses noted. Extremities:  No clubbing or edema.  No cyanosis. Neurologic:  Alert and oriented x3;  grossly normal neurologically. Skin:   Intact without significant lesions or rashes. No jaundice. Psych:  Alert and cooperative. Normal mood and affect.  Imaging Studies: None  Assessment and Plan:   Amber Gibson is a 20 y.o. African-American female with history of nonbloody diarrhea associated with epigastric pain, nausea, vomiting, abdominal cramps secondary to small bowel Crohn's.  EGD and colonoscopy were unremarkable.  Elevated fecal calprotectin levels.  GI profile PCR negative for infection.  Video capsule study was incomplete, however revealed scattered aphthous ulcers in distal small bowel.  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  Patient is started on Apriso 0.75 mg twice daily which did not help.  She developed clinical remission on budesonide 9 mg daily for 1 month followed by taper which resulted in exacerbation of her symptoms.  Therefore, restarted budesonide 9 mg daily.  Her fecal calprotectin levels persistently elevated Therefore, I have discussed with her regarding immunosuppressive therapy such as anti-TNF's or antiintegrin agents.  Patient chose to try adalimumab.  I have discussed in detail regarding the risks and benefits of this medication including but not limited to injection site reaction, allergic reaction, infection, small risk of lymphoma, shingles, skin cancer Patient is willing to proceed with Humira Recheck viral hepatitis panel, QuantiFERON gold Check TPMT levels Increase budesonide to 9 mg daily until patient starts Humira if approved by her insurance Empiric trial of rifaximin 550 mg twice daily for 2 weeks Discussed with patient about close monitoring of her labs once she starts Humira Up-to-date with Pap smear, negative Recommend Pneumovax and Shingrix vaccine  Follow up in 3-4 months   [name], MD

## 2021-05-15 NOTE — Telephone Encounter (Signed)
Sent mychart message

## 2021-05-15 NOTE — Telephone Encounter (Signed)
-----   Message from Toney Reil, MD sent at 05/15/2021  4:01 PM EDT ----- Regarding: Arman Filter  She will need Pneumovax vaccine.  Please message her on MyChart and have her come in within next couple of weeks  RV

## 2021-05-16 ENCOUNTER — Telehealth: Payer: Self-pay | Admitting: Gastroenterology

## 2021-05-16 NOTE — Telephone Encounter (Signed)
She just needs a nurse visit schedule can you please call her to schedule

## 2021-05-17 ENCOUNTER — Encounter: Payer: Self-pay | Admitting: Gastroenterology

## 2021-05-17 NOTE — Telephone Encounter (Signed)
Emailed Armed forces training and education officer with [employer] because they are the ones that usually submit the PA for the medication. Ask if they were doing this

## 2021-05-17 NOTE — Telephone Encounter (Signed)
Per Raiford Noble We are working on it. :)  Pryor Curia was doing benefits investigation this morning.

## 2021-05-18 ENCOUNTER — Encounter: Payer: Self-pay | Admitting: Gastroenterology

## 2021-05-19 LAB — QUANTIFERON-TB GOLD PLUS
QuantiFERON Mitogen Value: >10 IU/mL
QuantiFERON Nil Value: 0.01 IU/mL
QuantiFERON TB1 Ag Value: 0.02 IU/mL
QuantiFERON TB2 Ag Value: 0.01 IU/mL
QuantiFERON-TB Gold Plus: NEGATIVE

## 2021-05-19 LAB — HEPATITIS B SURFACE ANTIBODY,QUALITATIVE: Hep B Surface Ab, Qual: NONREACTIVE

## 2021-05-19 LAB — HEPATITIS A ANTIBODY, TOTAL: hep A Total Ab: NEGATIVE

## 2021-05-19 LAB — HEPATITIS B CORE ANTIBODY, TOTAL: Hep B Core Total Ab: NEGATIVE

## 2021-05-19 LAB — THIOPURINE METHYLTRANSFERASE (TPMT), RBC: TPMT Activity:: 18.4 Units/mL RBC

## 2021-05-19 LAB — HEPATITIS B SURFACE ANTIGEN: Hepatitis B Surface Ag: NEGATIVE

## 2021-05-19 LAB — HEPATITIS C ANTIBODY: Hep C Virus Ab: <0.1 s/co ratio (ref 0.0–0.9)

## 2021-05-22 ENCOUNTER — Other Ambulatory Visit: Payer: Self-pay

## 2021-05-22 ENCOUNTER — Ambulatory Visit (INDEPENDENT_AMBULATORY_CARE_PROVIDER_SITE_OTHER): Payer: BC Managed Care – PPO | Admitting: Gastroenterology

## 2021-05-22 DIAGNOSIS — Z23 Encounter for immunization: Secondary | ICD-10-CM | POA: Diagnosis not present

## 2021-05-22 MED ORDER — HUMIRA (2 SYRINGE) 40 MG/0.4ML ~~LOC~~ PSKT: PREFILLED_SYRINGE | SUBCUTANEOUS | 6 refills | Status: DC

## 2021-05-22 MED ORDER — HUMIRA-PED>/=40KG CROHNS START 80 MG/0.8ML ~~LOC~~ PSKT: PREFILLED_SYRINGE | SUBCUTANEOUS | 0 refills | Status: DC

## 2021-05-22 NOTE — Progress Notes (Unsigned)
Patient will be getting the Humira from [facility]. Sent through epic prescriptions to the pharmacy. Filled out Humira complete for patient and faxed

## 2021-05-31 ENCOUNTER — Telehealth: Payer: Self-pay

## 2021-05-31 NOTE — Telephone Encounter (Signed)
Submitted PA through [facility] on Humira starter pack and Humira 40mg /0.21ml. Waiting on response from insurance company

## 2021-07-14 ENCOUNTER — Encounter: Payer: Self-pay | Admitting: Gastroenterology

## 2021-07-14 ENCOUNTER — Other Ambulatory Visit: Payer: Self-pay | Admitting: Gastroenterology

## 2021-07-14 ENCOUNTER — Other Ambulatory Visit: Payer: Self-pay

## 2021-07-14 DIAGNOSIS — L732 Hidradenitis suppurativa: Secondary | ICD-10-CM

## 2021-07-14 DIAGNOSIS — K5000 Crohn's disease of small intestine without complications: Secondary | ICD-10-CM

## 2021-07-14 MED ORDER — DOXYCYCLINE HYCLATE 100 MG PO TBEC
100.0000 mg | DELAYED_RELEASE_TABLET | Freq: Two times a day (BID) | ORAL | 0 refills | Status: DC
Start: 2021-07-14 — End: 2021-07-14

## 2021-07-18 LAB — COMPREHENSIVE METABOLIC PANEL
ALT: 19 IU/L (ref 0–32)
AST: 18 IU/L (ref 0–40)
Albumin/Globulin Ratio: 1.5 (ref 1.2–2.2)
Albumin: 4.3 g/dL (ref 3.9–5.0)
Alkaline Phosphatase: 46 IU/L (ref 44–121)
BUN/Creatinine Ratio: 26 — ABNORMAL HIGH (ref 9–23)
BUN: 19 mg/dL (ref 6–20)
Bilirubin Total: 0.3 mg/dL (ref 0.0–1.2)
CO2: 23 mmol/L (ref 20–29)
Calcium: 9.5 mg/dL (ref 8.7–10.2)
Chloride: 102 mmol/L (ref 96–106)
Creatinine, Ser: 0.73 mg/dL (ref 0.57–1.00)
Globulin, Total: 2.9 g/dL (ref 1.5–4.5)
Glucose: 105 mg/dL — ABNORMAL HIGH (ref 70–99)
Potassium: 4.2 mmol/L (ref 3.5–5.2)
Sodium: 137 mmol/L (ref 134–144)
Total Protein: 7.2 g/dL (ref 6.0–8.5)
eGFR: 119 mL/min/{1.73_m2} (ref >59)

## 2021-07-18 LAB — CBC
Hematocrit: 38.5 % (ref 34.0–46.6)
Hemoglobin: 12.8 g/dL (ref 11.1–15.9)
MCH: 27.2 pg (ref 26.6–33.0)
MCHC: 33.2 g/dL (ref 31.5–35.7)
MCV: 82 fL (ref 79–97)
Platelets: 318 10*3/uL (ref 150–450)
RBC: 4.71 x10E6/uL (ref 3.77–5.28)
RDW: 12.3 % (ref 11.7–15.4)
WBC: 8.6 10*3/uL (ref 3.4–10.8)

## 2021-08-07 ENCOUNTER — Other Ambulatory Visit: Payer: Self-pay

## 2021-08-07 ENCOUNTER — Encounter: Payer: Self-pay | Admitting: Gastroenterology

## 2021-08-07 ENCOUNTER — Ambulatory Visit (INDEPENDENT_AMBULATORY_CARE_PROVIDER_SITE_OTHER): Payer: BC Managed Care – PPO | Admitting: Gastroenterology

## 2021-08-07 VITALS — BP 119/83 | HR 93 | Temp 98.4°F | Ht 64.0 in | Wt 125.2 lb

## 2021-08-07 DIAGNOSIS — L732 Hidradenitis suppurativa: Secondary | ICD-10-CM | POA: Diagnosis not present

## 2021-08-07 DIAGNOSIS — K5000 Crohn's disease of small intestine without complications: Secondary | ICD-10-CM

## 2021-08-07 NOTE — Progress Notes (Signed)
[name], MD [address]  [address]  [place], [place] [address]  Main: [phone]  Fax: [phone]    Gastroenterology Consultation  Referring Provider:     Miki Kins, FNP Primary Care Physician:  Miki Kins, FNP Primary Gastroenterologist:  Dr. Mia Creek Reason for Consultation:   Small bowel Crohn's        HPI:   Amber Gibson is a 20 y.o. female referred by Dr. Talbert Forest, Maryanna Shape, FNP  for consultation & management of chronic diarrhea and epigastric pain.  Patient has been experiencing frequent episodes of nonbloody diarrhea and occasionally with mixed with streaks of blood associated with abdominal cramps, epigastric pain associated with nausea and vomiting.  Patient reports that her symptoms started end of December, beginning of January.  She was originally seen by Dr. Vira Agar it [facility] clinic gastroenterology office.  She underwent upper endoscopy and colonoscopy which revealed scant, scattered aphthous ulcers in the terminal ileum.  Pathology results revealed acute ileitis only with no evidence of chronicity.  I do not have the official pathology results with me to review today.  She brought a copy of her upper endoscopy and colonoscopy reports with her.  Prior to her endoscopy evaluation, patient was on short course of prednisone with short course of Flagyl by her PCP as her ASCA antibodies came back positive, presuming that she has Crohn's disease.  Based on the pathology results and her symptoms, she was started on short course of prednisone by Dr. Mia Creek due to high index of clinical suspicion for Crohn's ileitis.  Patient reports that the steroids did not provide any significant relief of her symptoms.  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  She has been gradually losing weight but not at her prepregnancy weight.  She has a 78 months old daughter at home.  She denied any GI symptoms during her pregnancy.  She does report  significant abdominal bloating as well.  She is taking Protonix once a day as well as hyoscyamine 0.125 mg as needed.  Although patient denies stress in her life, her mom thinks she is stressful working as well as taking care of her daughter Patient loves to eat cheese pizza and cheese hamburger almost every other day Patient has significant leukocytosis with normocytic anemia in 08/2019.  Most recent labs from January 2022 at her PCPs office revealed normal CBC, CMP  Patient wanted to transfer her care for management of her GI symptoms  She does not smoke tobacco, denies alcohol use, denies marijuana use or IV drug abuse  Follow-up visit 02/08/2021: Patient is here for follow-up of chronic bloody diarrhea and epigastric pain.  She underwent video capsule endoscopy, found to have several aphthous ulcers scattered throughout the distal small bowel.  Therefore, given that she also has elevated fecal calprotectin levels and no infectious etiology found on stool studies, I have started her on Entocort 9 mg daily along with Apriso 0.75 mg twice daily.  Patient reports that the episodes of vomiting and diarrhea have almost resolved.  She does experience intermittent epigastric pain, mild to moderate in intensity about 1-2 times a week with no particular relation to food.  Her weight has been stable.  She does have abdominal bloating  Follow-up visit 05/15/2021 Amber Gibson is here for follow-up of small bowel Crohn's.  Her Crohn's disease is currently maintained on budesonide 6 mg p.o. daily.  Patient developed flareup upon tapering budesonide.  She went back to 9  mg daily which provided complete relief, she decreased to 6 mg daily.  Also, taking low-dose Apriso.  She is currently experiencing about twice weekly episodes of nausea and vomiting, intermittent diarrhea.  Her fecal calprotectin levels continue to rise.  Her weight is not stable.  Patient is accompanied by her mom today  Follow-up visit  08/07/2021 Amber Gibson is here for follow-up of small bowel Crohn's.  Patient started Humira in mid October 2022, standard induction dose followed by maintenance every 2 weeks.  After 1 month of starting Humira, patient noticed to have mild injection site reaction which was swelling and redness.  She also noticed to have tender bumps in her underarms or neck which she felt like boils with greenish drainage from the bump.  She contacted me about these lesions on my chart, I gave her 4 weeks of doxycycline 100 mg twice daily.  She reports that the lesions have resolved and is about to finish the treatment.  She had a CBC and CMP after 1 month of starting Humira which were unremarkable  NSAIDs: None  Antiplts/Anticoagulants/Anti thrombotics: None  GI Procedures: EGD and colonoscopy 11/24/2020 EGD normal, gastric biopsies were performed Colonoscopy revealed normal colon, aphthous ulcers in the terminal ileum, biopsies performed Video capsule endoscopy 12/21/2020 Capsule study was incomplete as capsule was retained in stomach for 3 hours 44 minutes.  Images were not captured beyond 8-hour time period.  Within the limited study, there were several aphthous ulcers scattered throughout the distal small bowel.  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  There is no history of NSAID use   Past Medical History:  Diagnosis Date   Crohn disease (HCC)    Menometrorrhagia    Orthostatic proteinuria    Vitamin D deficiency     Past Surgical History:  Procedure Laterality Date   GIVENS CAPSULE STUDY N/A 12/21/2020   Procedure: GIVENS CAPSULE STUDY;  Surgeon: Toney Reil, MD;  Location: [facility] ENDOSCOPY;  Service: Gastroenterology;  Laterality: N/A;   NO PAST SURGERIES      Current Outpatient Medications:    Adalimumab (HUMIRA PEDIATRIC CROHNS START) 80 MG/0.8ML PSKT, Inject 2 pens under the skin on day 1 and then 1 pen on day 15, Disp: 3 each, Rfl: 0   Adalimumab (HUMIRA) 40 MG/0.4ML  PSKT, Inject 1 pen under the skin every  other week., Disp: 2 each, Rfl: 6   doxycycline (MONODOX) 100 MG capsule, Take 1 capsule (100 mg total) by mouth 2 (two) times daily., Disp: 60 capsule, Rfl: 0   etonogestrel-ethinyl estradiol (NUVARING) 0.12-0.015 MG/24HR vaginal ring, , Disp: , Rfl:    Hyoscyamine Sulfate SL 0.125 MG SUBL, Place under the tongue., Disp: , Rfl:    pantoprazole (PROTONIX) 40 MG tablet, Take by mouth., Disp: , Rfl:    promethazine (PHENERGAN) 25 MG tablet, TAKE 1/2 TO 1 TABLET BY MOUTH UP TO THREE TIMES DAILY AS NEEDED FOR NAUSEA, Disp: , Rfl:    mesalamine (APRISO) 0.375 g 24 hr capsule, Take 2 capsules (0.75 g total) by mouth daily., Disp: 60 capsule, Rfl: 1   PAXLOVID, 300/100, 20 x 150 MG & 10 x 100MG  TBPK, Take 3 tablets by mouth 2 (two) times daily., Disp: , Rfl:    Family History  Problem Relation Age of Onset   Lupus Maternal Grandmother    Diabetes Maternal Grandmother    Heart disease Maternal Grandmother    Hypertension Maternal Grandmother    COPD Maternal Grandmother    Diabetes Maternal  Grandfather    Heart disease Maternal Grandfather    Hypertension Maternal Grandfather    Cancer Neg Hx    Stroke Neg Hx      Social History   Tobacco Use   Smoking status: Never   Smokeless tobacco: Never  Vaping Use   Vaping Use: Never used  Substance Use Topics   Alcohol use: No    Alcohol/week: 0.0 standard drinks   Drug use: No    Allergies as of 08/07/2021 - Review Complete 08/07/2021  Allergen Reaction Noted   Pineapple  01/21/2020    Review of Systems:    All systems reviewed and negative except where noted in HPI.   Physical Exam:  BP 119/83 (BP Location: Left Arm, Patient Position: Sitting, Cuff Size: Normal)   Pulse 93   Temp 98.4 F (36.9 C) (Oral)   Ht 5\' 4"  (1.626 m)   Wt 125 lb 3.2 oz (56.8 kg)   BMI 21.49 kg/m  No LMP recorded. (Menstrual status: Other).  General:   Alert,  Well-developed, well-nourished, pleasant and  cooperative in NAD Head:  Normocephalic and atraumatic. Eyes:  Sclera clear, no icterus.   Conjunctiva pink. Ears:  Normal auditory acuity. Nose:  No deformity, discharge, or lesions. Mouth:  No deformity or lesions,oropharynx pink & moist. Neck:  Supple; no masses or thyromegaly. Lungs:  Respirations even and unlabored.  Clear throughout to auscultation.   No wheezes, crackles, or rhonchi. No acute distress. Heart:  Regular rate and rhythm; no murmurs, clicks, rubs, or gallops. Abdomen:  Normal bowel sounds. Soft, non-tender and mildly distended, tympanic to percussion without masses, hepatosplenomegaly or hernias noted.  No guarding or rebound tenderness.   Rectal: Not performed Msk:  Symmetrical without gross deformities. Good, equal movement & strength bilaterally. Pulses:  Normal pulses noted. Extremities:  No clubbing or edema.  No cyanosis. Neurologic:  Alert and oriented x3;  grossly normal neurologically. Skin:  Intact without significant lesions or rashes. No jaundice. Psych:  Alert and cooperative. Normal mood and affect.  Imaging Studies: None  Assessment and Plan:   Dawnna Borroel is a 20 y.o. African-American female with history of nonbloody diarrhea associated with epigastric pain, nausea, vomiting, abdominal cramps secondary to small bowel Crohn's.  EGD was unremarkable and colonoscopy revealed aphthous ulcers, histology showed acute Ileitis with no evidence of chronicity, elevated fecal calprotectin levels 127 on 12/06/2020, 254 on 03/29/2021.  GI profile PCR negative for infection.  Video capsule study was incomplete, however revealed scattered aphthous ulcers in distal small bowel.  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  Patient is started on Apriso 0.75 mg twice daily which did not help.  She developed clinical remission on budesonide 9 mg daily for 1 month  followed by taper which resulted in exacerbation of her symptoms.  Therefore, restarted budesonide 9 mg daily.  Her fecal calprotectin levels were persistently elevated. Therefore, I have discussed with her regarding immunosuppressive therapy such as anti-TNF's or antiintegrin agents.  Patient chose to try adalimumab.  I have discussed in detail regarding the risks and benefits of this medication including but not limited to injection site reaction, allergic reaction, infection, small risk of lymphoma, shingles, skin cancer Patient is willing to proceed with Humira patient has started Humira in mid October 2022, currently maintained on biweekly monotherapy, in clinical remission Check fecal calprotectin levels Check adalimumab trough levels and antibodies Check CBC, LFTs every  3 months Recommend x-ray KUB to make sure that small bowel capsule has passed  Hidradenitis S/p treatment with doxycycline  IBD Health Maintenance  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 Anemia: None, no evidence of iron deficiency or B12 deficiency 3.Immunizations: Hep A and B vaccinated, Influenza annual influenza vaccine, up-to-date, prevnar received, pneumovax 9/22, Varicella immunized, Zoster first dose of Shingrix in end of September 2022 4.Cancer screening I) Colon cancer/dysplasia surveillance: None II) Cervical cancer: Pap smear up-to-date III) Skin cancer - counseled about annual skin exam by dermatology and skin protection in summer using sun screen SPF > 50, clothing 5.Bone health Vitamin D status: Has mild to moderate vitamin D deficiency, s/p replacement Recheck levels today Bone density testing: Not done 5. Labs: Every 3 months 6. Smoking: None 7. NSAIDs and Antibiotics use: None   Follow up in 3-4 months   Cephas Darby, MD

## 2021-08-08 ENCOUNTER — Other Ambulatory Visit: Payer: Self-pay | Admitting: Gastroenterology

## 2021-08-08 ENCOUNTER — Encounter: Payer: Self-pay | Admitting: Gastroenterology

## 2021-08-08 ENCOUNTER — Telehealth: Payer: Self-pay

## 2021-08-08 DIAGNOSIS — E559 Vitamin D deficiency, unspecified: Secondary | ICD-10-CM

## 2021-08-08 MED ORDER — VITAMIN D (ERGOCALCIFEROL) 1.25 MG (50000 UNIT) PO CAPS: 50000.0000 [IU] | ORAL_CAPSULE | ORAL | 0 refills | Status: DC

## 2021-08-08 NOTE — Telephone Encounter (Signed)
-----   Message from Toney Reil, MD sent at 08/08/2021  9:09 AM EST ----- Hope  Can you please call her about X Ray  RV

## 2021-08-08 NOTE — Telephone Encounter (Signed)
Called patient to let her know to go get xray and why she understands and will go just not today she is at work but she promised to go get it done at [facility] imaging soon

## 2021-08-11 ENCOUNTER — Ambulatory Visit
Admission: RE | Admit: 2021-08-11 | Discharge: 2021-08-11 | Disposition: A | Discharge status: Home / Self Care | Payer: BC Managed Care – PPO | Source: Ambulatory Visit | Attending: Gastroenterology | Admitting: Gastroenterology

## 2021-08-11 ENCOUNTER — Other Ambulatory Visit: Payer: Self-pay

## 2021-08-11 ENCOUNTER — Ambulatory Visit
Admission: RE | Admit: 2021-08-11 | Discharge: 2021-08-11 | Disposition: A | Discharge status: Home / Self Care | Payer: BC Managed Care – PPO | Attending: Gastroenterology | Admitting: Gastroenterology

## 2021-08-11 DIAGNOSIS — K5000 Crohn's disease of small intestine without complications: Secondary | ICD-10-CM | POA: Diagnosis not present

## 2021-08-13 LAB — CALPROTECTIN, FECAL: Calprotectin, Fecal: 49 ug/g (ref 0–120)

## 2021-08-13 LAB — SERIAL MONITORING

## 2021-08-14 ENCOUNTER — Encounter: Payer: Self-pay | Admitting: Gastroenterology

## 2021-08-14 LAB — ADALIMUMAB+AB (SERIAL MONITOR)
Adalimumab Drug Level: 14 ug/mL
Anti-Adalimumab Antibody: <25 ng/mL

## 2021-08-14 LAB — VITAMIN D 25 HYDROXY (VIT D DEFICIENCY, FRACTURES): Vit D, 25-Hydroxy: 15.7 ng/mL — ABNORMAL LOW (ref 30.0–100.0)

## 2021-10-19 ENCOUNTER — Encounter: Payer: Self-pay | Admitting: Gastroenterology

## 2021-11-01 ENCOUNTER — Other Ambulatory Visit: Payer: Self-pay | Admitting: Gastroenterology

## 2021-11-01 DIAGNOSIS — E559 Vitamin D deficiency, unspecified: Secondary | ICD-10-CM

## 2021-11-06 ENCOUNTER — Other Ambulatory Visit: Payer: Self-pay

## 2021-11-06 ENCOUNTER — Encounter: Payer: Self-pay | Admitting: Gastroenterology

## 2021-11-06 ENCOUNTER — Ambulatory Visit (INDEPENDENT_AMBULATORY_CARE_PROVIDER_SITE_OTHER): Payer: BC Managed Care – PPO | Admitting: Gastroenterology

## 2021-11-06 VITALS — BP 127/77 | HR 97 | Temp 98.6°F | Ht 64.0 in | Wt 127.4 lb

## 2021-11-06 DIAGNOSIS — K5000 Crohn's disease of small intestine without complications: Secondary | ICD-10-CM

## 2021-11-06 MED ORDER — NA SULFATE-K SULFATE-MG SULF 17.5-3.13-1.6 GM/177ML PO SOLN: 354.0000 mL | Freq: Once | ORAL | 0 refills | Status: AC

## 2021-11-06 NOTE — Progress Notes (Unsigned)
Amber Darby, MD [address]  [place]  [place], [place] [address]  Main: [phone]  Fax: [phone]    Gastroenterology Consultation  Referring Provider:     Mechele Claude, FNP Primary Care Physician:  Mechele Claude, FNP Primary Gastroenterologist:  Dr. Haig Prophet Reason for Consultation:   Small bowel Crohn's        HPI:   Amber Gibson is a 21 y.o. female referred by Dr. Enid Derry, Donald Prose, [place]  for consultation & management of chronic diarrhea and epigastric pain.  Patient has been experiencing frequent episodes of nonbloody diarrhea and occasionally with mixed with streaks of blood associated with abdominal cramps, epigastric pain associated with nausea and vomiting.  Patient reports that her symptoms started end of December, beginning of January.  She was originally seen by Dr. Joselyn Arrow it [facility] clinic gastroenterology office.  She underwent upper endoscopy and colonoscopy which revealed scant, scattered aphthous ulcers in the terminal ileum.  Pathology results revealed acute ileitis only with no evidence of chronicity.  I do not have the official pathology results with me to review today.  She brought a copy of her upper endoscopy and colonoscopy reports with her.  Prior to her endoscopy evaluation, patient was on short course of prednisone with short course of Flagyl by her PCP as her ASCA antibodies came back positive, presuming that she has Crohn's disease.  Based on the pathology results and her symptoms, she was started on short course of prednisone by Dr. Haig Prophet due to high index of clinical suspicion for Crohn's ileitis.  Patient reports that the steroids did not provide any significant relief of her symptoms.  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  She has been gradually losing weight but not at her prepregnancy weight.  She has a 63 months old daughter at home.  She denied any GI symptoms during her pregnancy.  She does report  significant abdominal bloating as well.  She is taking Protonix once a day as well as hyoscyamine 0.125 mg as needed.  Although patient denies stress in her life, her mom thinks she is stressful working as well as taking care of her daughter Patient loves to eat cheese pizza and cheese hamburger almost every other day Patient has significant leukocytosis with normocytic anemia in 08/2019.  Most recent labs from January 2022 at her PCPs office revealed normal CBC, CMP  Patient wanted to transfer her care for management of her GI symptoms  She does not smoke tobacco, denies alcohol use, denies marijuana use or IV drug abuse  Follow-up visit 02/08/2021: Patient is here for follow-up of chronic bloody diarrhea and epigastric pain.  She underwent video capsule endoscopy, found to have several aphthous ulcers scattered throughout the distal small bowel.  Therefore, given that she also has elevated fecal calprotectin levels and no infectious etiology found on stool studies, I have started her on Entocort 9 mg daily along with Apriso 0.75 mg twice daily.  Patient reports that the episodes of vomiting and diarrhea have almost resolved.  She does experience intermittent epigastric pain, mild to moderate in intensity about 1-2 times a week with no particular relation to food.  Her weight has been stable.  She does have abdominal bloating  Follow-up visit 05/15/2021 [name] is here for follow-up of small bowel Crohn's.  Her Crohn's disease is currently maintained on budesonide 6 mg p.o. daily.  Patient developed flareup upon tapering budesonide.  She went back to 9  mg daily which provided complete relief, she decreased to 6 mg daily.  Also, taking low-dose Apriso.  She is currently experiencing about twice weekly episodes of nausea and vomiting, intermittent diarrhea.  Her fecal calprotectin levels continue to rise.  Her weight is not stable.  Patient is accompanied by her mom today  Follow-up visit  08/07/2021 Amber Gibson is here for follow-up of small bowel Crohn's.  Patient started Humira in mid October 2022, standard induction dose followed by maintenance every 2 weeks.  After 1 month of starting Humira, patient noticed to have mild injection site reaction which was swelling and redness.  She also noticed to have tender bumps in her underarms or neck which she felt like boils with greenish drainage from the bump.  She contacted me about these lesions on my chart, I gave her 4 weeks of doxycycline 100 mg twice daily.  She reports that the lesions have resolved and is about to finish the treatment.  She had a CBC and CMP after 1 month of starting Humira which were unremarkable  NSAIDs: None  Antiplts/Anticoagulants/Anti thrombotics: None  GI Procedures: EGD and colonoscopy 11/24/2020 EGD normal, gastric biopsies were performed Colonoscopy revealed normal colon, aphthous ulcers in the terminal ileum, biopsies performed Video capsule endoscopy 12/21/2020 Capsule study was incomplete as capsule was retained in stomach for 3 hours 44 minutes.  Images were not captured beyond 8-hour time period.  Within the limited study, there were several aphthous ulcers scattered throughout the distal small bowel.  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  There is no history of NSAID use   Past Medical History:  Diagnosis Date   Crohn disease ([place])    Menometrorrhagia    Orthostatic proteinuria    Vitamin D deficiency     Past Surgical History:  Procedure Laterality Date   GIVENS CAPSULE STUDY N/A 12/21/2020   Procedure: GIVENS CAPSULE STUDY;  Surgeon: Lin Landsman, MD;  Location: [facility] ENDOSCOPY;  Service: Gastroenterology;  Laterality: N/A;   NO PAST SURGERIES      Current Outpatient Medications:    Adalimumab (HUMIRA) 40 MG/0.4ML PSKT, Inject 1 pen under the skin every  other week., Disp: 2 each, Rfl: 6   etonogestrel-ethinyl estradiol (NUVARING) 0.12-0.015 MG/24HR vaginal  ring, , Disp: , Rfl:    Na Sulfate-K Sulfate-Mg Sulf 17.5-3.13-1.6 GM/177ML SOLN, Take 354 mLs by mouth once for 1 dose., Disp: 354 mL, Rfl: 0   promethazine (PHENERGAN) 25 MG tablet, TAKE 1/2 TO 1 TABLET BY MOUTH UP TO THREE TIMES DAILY AS NEEDED FOR NAUSEA, Disp: , Rfl:    Vitamin D, Ergocalciferol, (DRISDOL) 1.25 MG (50000 UNIT) CAPS capsule, TAKE 1 CAPSULE (50,000 UNITS TOTAL) BY MOUTH EVERY 7 (SEVEN) DAYS FOR 12 DOSES., Disp: 12 capsule, Rfl: 0   Family History  Problem Relation Age of Onset   Lupus Maternal Grandmother    Diabetes Maternal Grandmother    Heart disease Maternal Grandmother    Hypertension Maternal Grandmother    COPD Maternal Grandmother    Diabetes Maternal Grandfather    Heart disease Maternal Grandfather    Hypertension Maternal Grandfather    Cancer Neg Hx    Stroke Neg Hx      Social History   Tobacco Use   Smoking status: Never   Smokeless tobacco: Never  Vaping Use   Vaping Use: Never used  Substance Use Topics   Alcohol use: No    Alcohol/week: 0.0 standard drinks   Drug use: No  Allergies as of 11/06/2021 - Review Complete 11/06/2021  Allergen Reaction Noted   Pineapple  01/21/2020    Review of Systems:    All systems reviewed and negative except where noted in HPI.   Physical Exam:  BP 127/77 (BP Location: Left Arm, Patient Position: Sitting, Cuff Size: Normal)    Pulse 97    Temp 98.6 F (37 C) (Oral)    Ht 5\' 4"  (1.626 m)    Wt 127 lb 6 oz (57.8 kg)    BMI 21.86 kg/m  No LMP recorded. (Menstrual status: Other).  General:   Alert,  Well-developed, well-nourished, pleasant and cooperative in NAD Head:  Normocephalic and atraumatic. Eyes:  Sclera clear, no icterus.   Conjunctiva pink. Ears:  Normal auditory acuity. Nose:  No deformity, discharge, or lesions. Mouth:  No deformity or lesions,oropharynx pink & moist. Neck:  Supple; no masses or thyromegaly. Lungs:  Respirations even and unlabored.  Clear throughout to auscultation.    No wheezes, crackles, or rhonchi. No acute distress. Heart:  Regular rate and rhythm; no murmurs, clicks, rubs, or gallops. Abdomen:  Normal bowel sounds. Soft, non-tender and mildly distended, tympanic to percussion without masses, hepatosplenomegaly or hernias noted.  No guarding or rebound tenderness.   Rectal: Not performed Msk:  Symmetrical without gross deformities. Good, equal movement & strength bilaterally. Pulses:  Normal pulses noted. Extremities:  No clubbing or edema.  No cyanosis. Neurologic:  Alert and oriented x3;  grossly normal neurologically. Skin:  Intact without significant lesions or rashes. No jaundice. Psych:  Alert and cooperative. Normal mood and affect.  Imaging Studies: None  Assessment and Plan:   Pankie Turnbull is a 21 y.o. African-American female with history of nonbloody diarrhea associated with epigastric pain, nausea, vomiting, abdominal cramps secondary to small bowel Crohn's.  EGD was unremarkable and colonoscopy revealed aphthous ulcers, histology showed acute Ileitis with no evidence of chronicity, elevated fecal calprotectin levels 127 on 12/06/2020, 254 on 03/29/2021.  GI profile PCR negative for infection.  Video capsule study was incomplete, however revealed scattered aphthous ulcers in distal small bowel.  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  Patient is started on Apriso 0.75 mg twice daily which did not help.  She developed clinical remission on budesonide 9 mg daily for 1 month followed by taper which resulted in exacerbation of her symptoms.  Therefore, restarted budesonide 9 mg daily.  Her fecal calprotectin levels were persistently elevated. Therefore, I have discussed with her regarding immunosuppressive therapy such as anti-TNF's or antiintegrin agents.  Patient chose to try adalimumab.  I have discussed in detail regarding the risks  and benefits of this medication including but not limited to injection site reaction, allergic reaction, infection, small risk of lymphoma, shingles, skin cancer Patient is willing to proceed with Humira patient has started Humira in mid October 2022, currently maintained on biweekly monotherapy, in clinical remission Check fecal calprotectin levels Check adalimumab trough levels and antibodies Check CBC, LFTs every 3 months Recommend x-ray KUB to make sure that small bowel capsule has passed  Hidradenitis S/p treatment with doxycycline  IBD Health Maintenance  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 Anemia: None, no evidence of iron deficiency or B12 deficiency 3.Immunizations: Hep A and B vaccinated, Influenza annual influenza vaccine, up-to-date, prevnar received, pneumovax 9/22, Varicella immunized, Zoster first dose of Shingrix in end of September 2022 4.Cancer screening I) Colon cancer/dysplasia surveillance:  None II) Cervical cancer: Pap smear up-to-date III) Skin cancer - counseled about annual skin exam by dermatology and skin protection in summer using sun screen SPF > 50, clothing 5.Bone health Vitamin D status: Has mild to moderate vitamin D deficiency, s/p replacement Recheck levels today Bone density testing: Not done 5. Labs: Every 3 months 6. Smoking: None 7. NSAIDs and Antibiotics use: None   Follow up in 3-4 months   Amber Darby, MD

## 2021-11-07 LAB — CBC
Hematocrit: 35.7 % (ref 34.0–46.6)
Hemoglobin: 12.3 g/dL (ref 11.1–15.9)
MCH: 28.1 pg (ref 26.6–33.0)
MCHC: 34.5 g/dL (ref 31.5–35.7)
MCV: 82 fL (ref 79–97)
NRBC: 9 % — ABNORMAL HIGH (ref 0–0)
Platelets: 331 10*3/uL (ref 150–450)
RBC: 4.37 x10E6/uL (ref 3.77–5.28)
RDW: 12.4 % (ref 11.7–15.4)
WBC: 7.7 10*3/uL (ref 3.4–10.8)

## 2021-11-07 LAB — COMPREHENSIVE METABOLIC PANEL
ALT: 15 IU/L (ref 0–32)
AST: 15 IU/L (ref 0–40)
Albumin/Globulin Ratio: 1.3 (ref 1.2–2.2)
Albumin: 4 g/dL (ref 3.9–5.0)
Alkaline Phosphatase: 43 IU/L — ABNORMAL LOW (ref 44–121)
BUN/Creatinine Ratio: 28 — ABNORMAL HIGH (ref 9–23)
BUN: 21 mg/dL — ABNORMAL HIGH (ref 6–20)
Bilirubin Total: <0.2 mg/dL (ref 0.0–1.2)
CO2: 23 mmol/L (ref 20–29)
Calcium: 9.4 mg/dL (ref 8.7–10.2)
Chloride: 107 mmol/L — ABNORMAL HIGH (ref 96–106)
Creatinine, Ser: 0.74 mg/dL (ref 0.57–1.00)
Globulin, Total: 3 g/dL (ref 1.5–4.5)
Glucose: 76 mg/dL (ref 70–99)
Potassium: 4.5 mmol/L (ref 3.5–5.2)
Sodium: 140 mmol/L (ref 134–144)
Total Protein: 7 g/dL (ref 6.0–8.5)
eGFR: 117 mL/min/{1.73_m2} (ref >59)

## 2021-11-07 LAB — VITAMIN D 25 HYDROXY (VIT D DEFICIENCY, FRACTURES): Vit D, 25-Hydroxy: 34.1 ng/mL (ref 30.0–100.0)

## 2021-11-16 ENCOUNTER — Other Ambulatory Visit: Payer: Self-pay | Admitting: Gastroenterology

## 2021-11-21 LAB — CALPROTECTIN, FECAL: Calprotectin, Fecal: 61 ug/g (ref 0–120)

## 2021-12-11 IMAGING — US US AXILLARY RIGHT
1 series · 4 of 4 positions shown · non-contrast
Comparison: None.

CLINICAL DATA: 19-year-old female with a palpable right axillary
lump which has now completely resolved.

EXAM:
ULTRASOUND OF THE RIGHT AXILLA

[Series 1: us axillary right · 0.06mm/px · 4 of 4 slices shown]
[im 1/4]
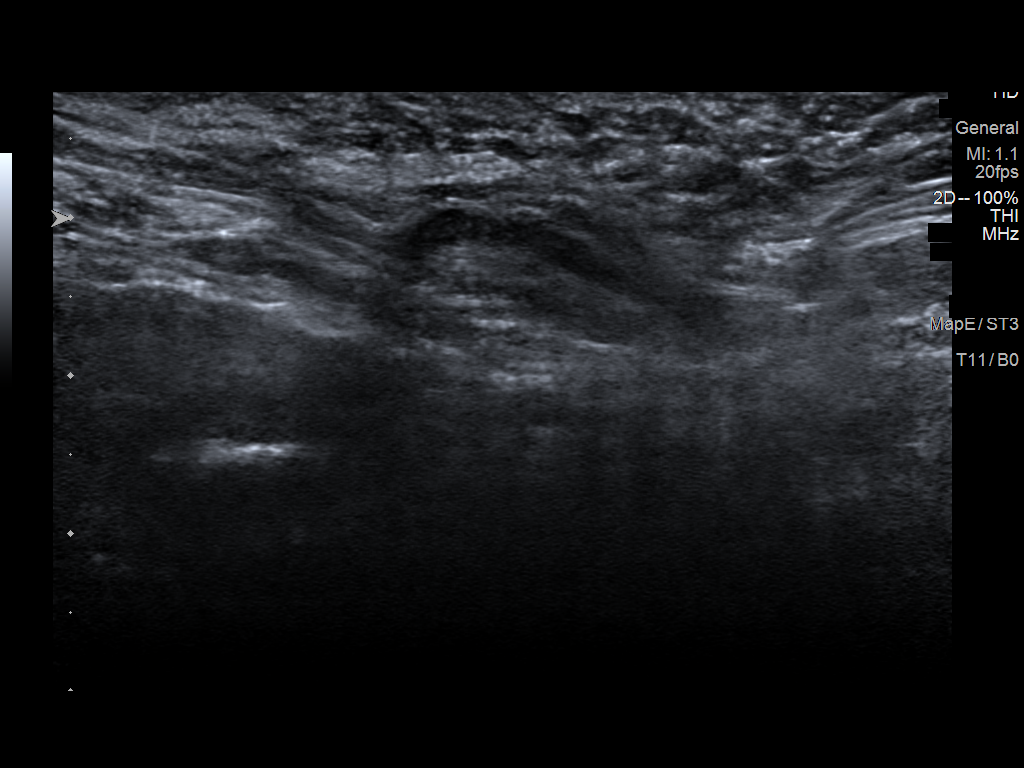
[im 2/4]
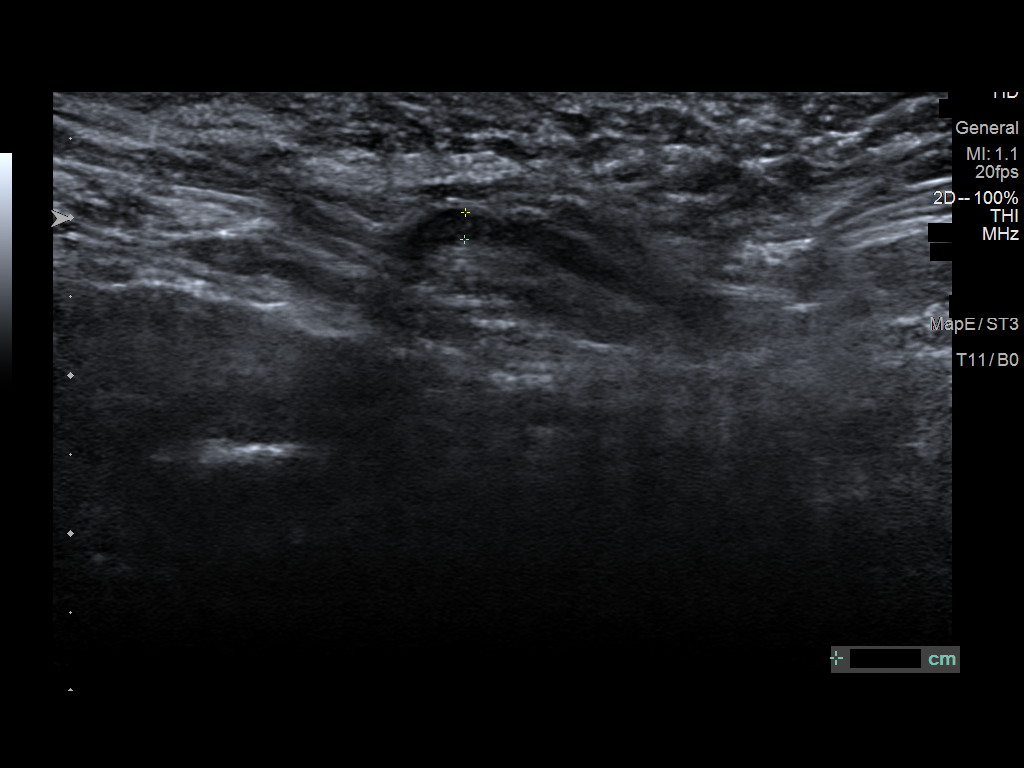
[im 3/4]
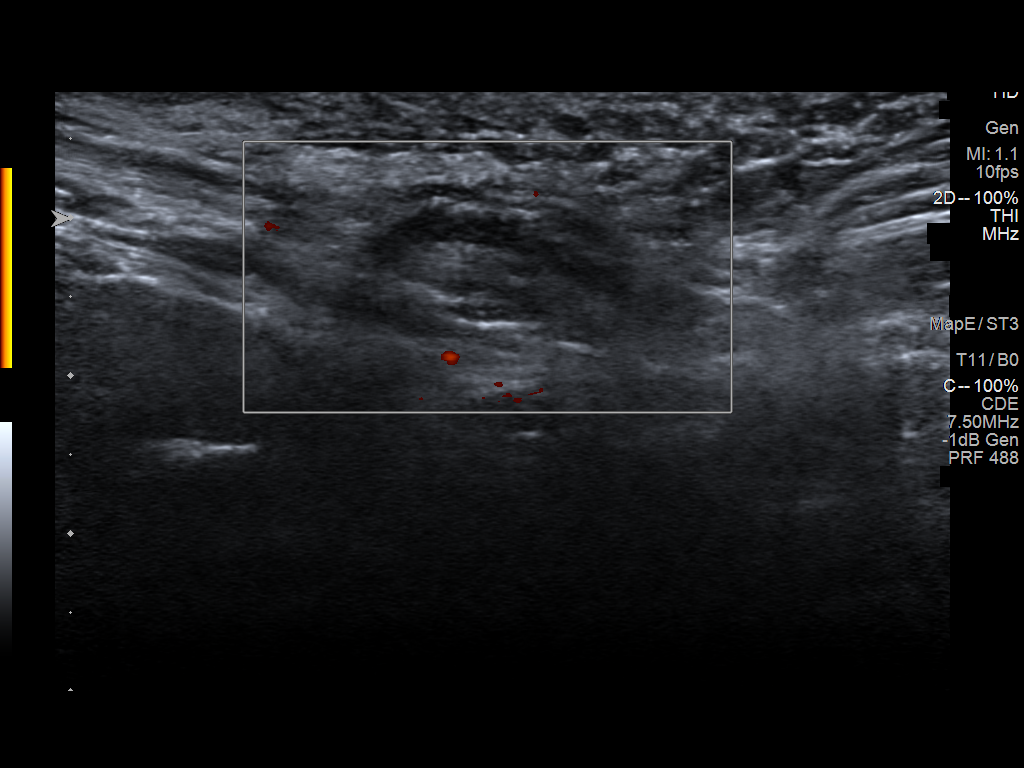
[im 4/4]
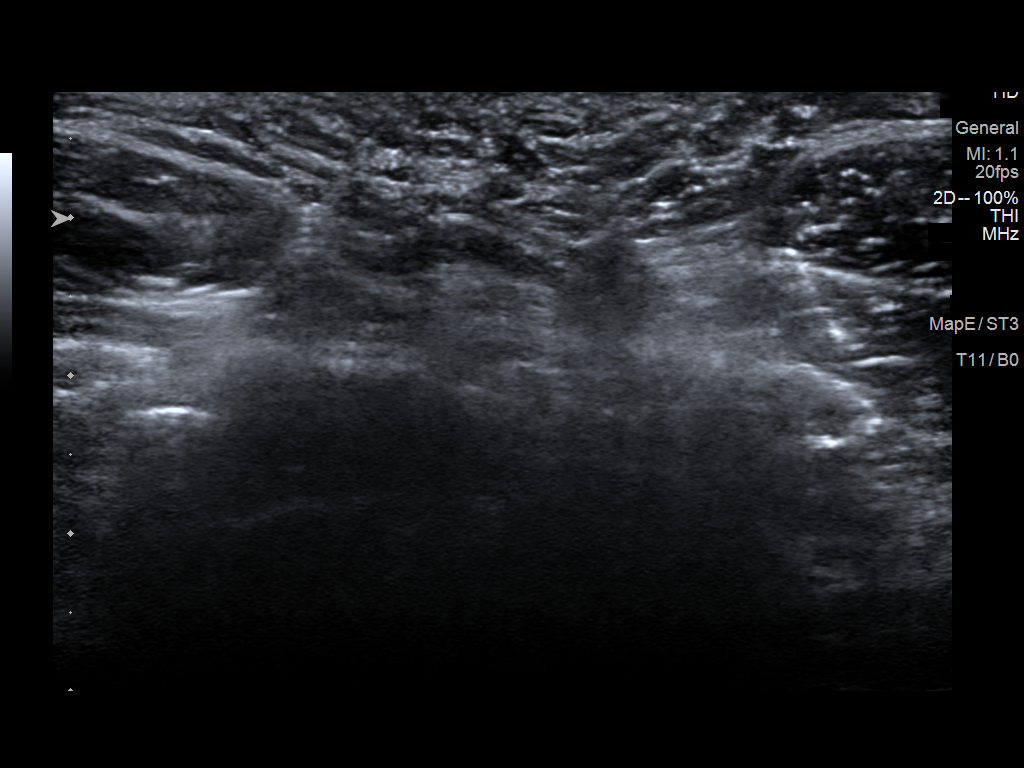

[4 of 4 positions shown; findings below may reference images not displayed]

FINDINGS: Ultrasound is performed, showing there are no suspicious findings in
the right axilla. Note is made of a single morphologically normal
lymph node.
IMPRESSION: Unremarkable ultrasound evaluation of the right axilla.

RECOMMENDATION:
1. Clinical follow-up recommended for the symptomatic area of
concern in the right axilla. Any further workup should be based on
clinical grounds.
2. Screening mammogram at age 38 unless there are persistent or
intervening clinical concerns. (Code:T1-Q-IUS)

I have discussed the findings and recommendations with the patient.
If applicable, a reminder letter will be sent to the patient
regarding the next appointment.

BI-RADS CATEGORY  1: Negative.

## 2021-12-27 ENCOUNTER — Other Ambulatory Visit: Payer: Self-pay | Admitting: Gastroenterology

## 2022-01-15 ENCOUNTER — Encounter: Payer: Self-pay | Admitting: Gastroenterology

## 2022-01-15 ENCOUNTER — Other Ambulatory Visit: Payer: Self-pay

## 2022-01-15 ENCOUNTER — Ambulatory Visit: Payer: BC Managed Care – PPO | Admitting: Certified Registered Nurse Anesthetist

## 2022-01-15 ENCOUNTER — Ambulatory Visit
Admission: RE | Admit: 2022-01-15 | Discharge: 2022-01-15 | Disposition: A | Discharge status: Home / Self Care | Payer: BC Managed Care – PPO | Attending: Gastroenterology | Admitting: Gastroenterology

## 2022-01-15 ENCOUNTER — Encounter
Admission: RE | Disposition: A | Discharge status: Home / Self Care | Payer: Self-pay | Source: Home / Self Care | Attending: Gastroenterology

## 2022-01-15 DIAGNOSIS — D649 Anemia, unspecified: Secondary | ICD-10-CM | POA: Insufficient documentation

## 2022-01-15 DIAGNOSIS — Z09 Encounter for follow-up examination after completed treatment for conditions other than malignant neoplasm: Secondary | ICD-10-CM | POA: Insufficient documentation

## 2022-01-15 DIAGNOSIS — K5000 Crohn's disease of small intestine without complications: Secondary | ICD-10-CM | POA: Insufficient documentation

## 2022-01-15 DIAGNOSIS — D759 Disease of blood and blood-forming organs, unspecified: Secondary | ICD-10-CM | POA: Diagnosis not present

## 2022-01-15 DIAGNOSIS — K644 Residual hemorrhoidal skin tags: Secondary | ICD-10-CM | POA: Insufficient documentation

## 2022-01-15 HISTORY — PX: COLONOSCOPY WITH PROPOFOL: SHX5780

## 2022-01-15 LAB — POCT PREGNANCY, URINE: Preg Test, Ur: NEGATIVE

## 2022-01-15 SURGERY — COLONOSCOPY WITH PROPOFOL
Anesthesia: General

## 2022-01-15 MED ORDER — ONDANSETRON HCL 4 MG/2ML IJ SOLN
INTRAMUSCULAR | Status: AC
  Filled 2022-01-15: qty 2

## 2022-01-15 MED ORDER — SODIUM CHLORIDE 0.9 % IV SOLN: INTRAVENOUS | Status: DC

## 2022-01-15 MED ORDER — PROPOFOL 10 MG/ML IV BOLUS
INTRAVENOUS | Status: DC | PRN
  Administered 2022-01-15: 50 mg via INTRAVENOUS
  Administered 2022-01-15: 30 mg via INTRAVENOUS
  Administered 2022-01-15 (×2): 20 mg via INTRAVENOUS

## 2022-01-15 MED ORDER — PROPOFOL 500 MG/50ML IV EMUL
INTRAVENOUS | Status: DC | PRN
  Administered 2022-01-15: 140 ug/kg/min via INTRAVENOUS

## 2022-01-15 NOTE — H&P (Signed)
[name], MD [address]  [address]  [place], [place] [address]  Main: [phone]  Fax: [phone] Pager: [phone]  Primary Care Physician:  Miki Kins, FNP Primary Gastroenterologist:  Dr. [name]  Pre-Procedure History & Physical: HPI:  Amber Gibson is a 21 y.o. female is here for an colonoscopy.   Past Medical History:  Diagnosis Date   Crohn disease ([facility])    Menometrorrhagia    Orthostatic proteinuria    Vitamin D deficiency     Past Surgical History:  Procedure Laterality Date   GIVENS CAPSULE STUDY N/A 12/21/2020   Procedure: GIVENS CAPSULE STUDY;  Surgeon: Toney Reil, MD;  Location: [facility] ENDOSCOPY;  Service: Gastroenterology;  Laterality: N/A;   NO PAST SURGERIES      Prior to Admission medications   Medication Sig Start Date End Date Taking? Authorizing Provider  etonogestrel-ethinyl estradiol (NUVARING) 0.12-0.015 MG/24HR vaginal ring  12/24/19   [provider]  HUMIRA PEN 40 MG/0.4ML PNKT INJECT 1 PEN UNDER THE SKIN EVERY 14 DAYS. 12/27/21   Briarrose Shor, Loel Dubonnet, MD  promethazine (PHENERGAN) 25 MG tablet TAKE 1/2 TO 1 TABLET BY MOUTH UP TO THREE TIMES DAILY AS NEEDED FOR NAUSEA 11/09/20   [provider]  Vitamin D, Ergocalciferol, (DRISDOL) 1.25 MG (50000 UNIT) CAPS capsule TAKE 1 CAPSULE (50,000 UNITS TOTAL) BY MOUTH EVERY 7 (SEVEN) DAYS FOR 12 DOSES. 11/01/21 01/18/22  Toney Reil, MD    Allergies as of 11/07/2021 - Review Complete 11/06/2021  Allergen Reaction Noted   Pineapple  01/21/2020    Family History  Problem Relation Age of Onset   Lupus Maternal Grandmother    Diabetes Maternal Grandmother    Heart disease Maternal Grandmother    Hypertension Maternal Grandmother    COPD Maternal Grandmother    Diabetes Maternal Grandfather    Heart disease Maternal Grandfather    Hypertension Maternal Grandfather    Cancer Neg Hx    Stroke Neg Hx     Social History   Socioeconomic  History   Marital status: Single    Spouse name: Information systems manager   Number of children: Not on file   Years of education: Not on file   Highest education level: Not on file  Occupational History   Not on file  Tobacco Use   Smoking status: Never   Smokeless tobacco: Never  Vaping Use   Vaping Use: Never used  Substance and Sexual Activity   Alcohol use: No    Alcohol/week: 0.0 standard drinks   Drug use: No   Sexual activity: Yes    Birth control/protection: None  Other Topics Concern   Not on file  Social History Narrative   Not on file   Social Determinants of Health   Financial Resource Strain: Not on file  Food Insecurity: Not on file  Transportation Needs: Not on file  Physical Activity: Not on file  Stress: Not on file  Social Connections: Not on file  Intimate Partner Violence: Not on file    Review of Systems: See HPI, otherwise negative ROS  Physical Exam: BP 117/87   Pulse 80   Temp (!) 96.7 F (35.9 C) (Temporal)   Resp 18   Ht 5\' 3"  (1.6 m)   Wt 54.4 kg   SpO2 100%   BMI 21.26 kg/m  General:   Alert,  pleasant and cooperative in NAD Head:  Normocephalic and atraumatic. Neck:  Supple; no masses or thyromegaly. Lungs:  Clear  throughout to auscultation.    Heart:  Regular rate and rhythm. Abdomen:  Soft, nontender and nondistended. Normal bowel sounds, without guarding, and without rebound.   Neurologic:  Alert and  oriented x4;  grossly normal neurologically.  Impression/Plan: Amber Gibson is here for an colonoscopy to be performed for h/o small bowel crohn's  Risks, benefits, limitations, and alternatives regarding  colonoscopy have been reviewed with the patient.  Questions have been answered.  All parties agreeable.   Lannette Donath, MD  01/15/2022, 9:13 AM

## 2022-01-15 NOTE — Anesthesia Procedure Notes (Signed)
Date/Time: 01/15/2022 9:26 AM Performed by: Demetrius Charity, CRNA Pre-anesthesia Checklist: Patient identified, Emergency Drugs available, Suction available, Patient being monitored and Timeout performed Patient Re-evaluated:Patient Re-evaluated prior to induction Oxygen Delivery Method: Nasal cannula Induction Type: IV induction Placement Confirmation: CO2 detector and positive ETCO2

## 2022-01-15 NOTE — Op Note (Signed)
[facility] Gastroenterology Patient Name: Amber Gibson Procedure Date: 01/15/2022 9:10 AM MRN: [id number] Account #: [id number] Date of Birth: July 12, 2001 Admit Type: Outpatient Age: 21 Room: [facility] ENDO ROOM 1 Gender: Female Note Status: Finalized Instrument Name: Peds Colonoscope [id number] Procedure:             Colonoscopy Indications:           Disease activity assessment of Crohn's disease of the                         small bowel, Assess therapeutic response to therapy of                         Crohn's disease of the small bowel Providers:             Toney Reil MD, MD Referring MD:          Grayling Congress Medicines:             General Anesthesia Complications:         No immediate complications. Estimated blood loss:                         Minimal. Procedure:             Pre-Anesthesia Assessment:                        - Prior to the procedure, a History and Physical was                         performed, and patient medications and allergies were                         reviewed. The patient is competent. The risks and                         benefits of the procedure and the sedation options and                         risks were discussed with the patient. All questions                         were answered and informed consent was obtained.                         Patient identification and proposed procedure were                         verified by the physician, the nurse, the                         anesthesiologist, the anesthetist and the technician                         in the pre-procedure area in the procedure room in the                         endoscopy suite. Mental Status Examination: alert and  oriented. Airway Examination: normal oropharyngeal                         airway and neck mobility. Respiratory Examination:                         clear to auscultation. CV Examination: normal.                          Prophylactic Antibiotics: The patient does not require                         prophylactic antibiotics. Prior Anticoagulants: The                         patient has taken no previous anticoagulant or                         antiplatelet agents. ASA Grade Assessment: I - A                         normal, healthy patient. After reviewing the risks and                         benefits, the patient was deemed in satisfactory                         condition to undergo the procedure. The anesthesia                         plan was to use general anesthesia. Immediately prior                         to administration of medications, the patient was                         re-assessed for adequacy to receive sedatives. The                         heart rate, respiratory rate, oxygen saturations,                         blood pressure, adequacy of pulmonary ventilation, and                         response to care were monitored throughout the                         procedure. The physical status of the patient was                         re-assessed after the procedure.                        After obtaining informed consent, the colonoscope was                         passed under direct vision. Throughout the procedure,  the patient's blood pressure, pulse, and oxygen                         saturations were monitored continuously. The                         Colonoscope was introduced through the anus and                         advanced to the 10 cm into the ileum. The colonoscopy                         was performed without difficulty. The patient                         tolerated the procedure well. The quality of the bowel                         preparation was evaluated using the BBPS [facility] Bowel                         Preparation Scale) with scores of: Right Colon = 3,                         Transverse Colon = 3 and Left Colon = 3 (entire mucosa                          seen well with no residual staining, small fragments                         of stool or opaque liquid). The total BBPS score                         equals 9. Findings:      The perianal and digital rectal examinations were normal. Pertinent       negatives include normal sphincter tone and no palpable rectal lesions.      The terminal ileum appeared normal. Biopsies were taken with a cold       forceps for histology.      The colon (entire examined portion) appeared normal. Biopsies were taken       with a cold forceps for histology. To prevent bleeding after the biopsy,       one hemostatic clip was successfully placed (MR conditional). There was       no bleeding at the end of the procedure.      The retroflexed view of the distal rectum and anal verge was normal and       showed no anal or rectal abnormalities.      Non-bleeding external hemorrhoids were found during retroflexion. The       hemorrhoids were medium-sized. Impression:            - The examined portion of the ileum was normal.                         Biopsied.                        - The entire examined  colon is normal. Biopsied. Clip                         (MR conditional) was placed.                        - The distal rectum and anal verge are normal on                         retroflexion view.                        - Non-bleeding external hemorrhoids. Recommendation:        - Discharge patient to home (with parent).                        - Resume regular diet today.                        - Continue present medications.                        - Await pathology results.                        - Return to my office as previously scheduled. Procedure Code(s):     --- Professional ---                        [phone], Colonoscopy, flexible; with biopsy, single or                         multiple Diagnosis Code(s):     --- Professional ---                        K50.00, Crohn's disease of small  intestine without                         complications CPT copyright 2019 American Medical Association. All rights reserved. The codes documented in this report are preliminary and upon coder review may  be revised to meet current compliance requirements. Dr. Libby Maw Toney Reil MD, MD 01/15/2022 9:49:53 AM This report has been signed electronically. Number of Addenda: 0 Note Initiated On: 01/15/2022 9:10 AM Scope Withdrawal Time: 0 hours 11 minutes 7 seconds  Total Procedure Duration: 0 hours 13 minutes 45 seconds  Estimated Blood Loss:  Estimated blood loss: none. Estimated blood loss was                         minimal. Estimated blood loss was minimal.      [facility]

## 2022-01-15 NOTE — Anesthesia Preprocedure Evaluation (Signed)
Anesthesia Evaluation  Patient identified by MRN, date of birth, ID band Patient awake    Reviewed: Allergy & Precautions, NPO status , Patient's Chart, lab work & pertinent test results  Airway Mallampati: I  TM Distance: >3 FB Neck ROM: Full    Dental  (+) Teeth Intact   Pulmonary neg pulmonary ROS,    Pulmonary exam normal breath sounds clear to auscultation       Cardiovascular Exercise Tolerance: Good negative cardio ROS Normal cardiovascular exam Rhythm:Regular Rate:Normal     Neuro/Psych negative neurological ROS  negative psych ROS   GI/Hepatic negative GI ROS, Neg liver ROS,   Endo/Other  negative endocrine ROS  Renal/GU negative Renal ROS  negative genitourinary   Musculoskeletal   Abdominal Normal abdominal exam  (+)   Peds negative pediatric ROS (+)  Hematology negative hematology ROS (+) Blood dyscrasia, anemia ,   Anesthesia Other Findings Past Medical History: No date: Crohn disease (HCC) No date: Menometrorrhagia 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 No date: Vitamin D deficiency  Past Surgical History: 12/21/2020: GIVENS CAPSULE STUDY; N/A     Comment:  Procedure: GIVENS CAPSULE STUDY;  Surgeon: Toney Reil, MD;  Location: [facility] ENDOSCOPY;  Service:               Gastroenterology;  Laterality: N/A; No date: NO PAST SURGERIES  BMI    Body Mass Index: 21.26 kg/m      Reproductive/Obstetrics negative OB ROS                             Anesthesia Physical Anesthesia Plan  ASA: 1  Anesthesia Plan: General   Post-op Pain Management:    Induction: Intravenous  PONV Risk Score and Plan: Propofol infusion and TIVA  Airway Management Planned: Natural Airway and Nasal Cannula  Additional Equipment:   Intra-op Plan:   Post-operative Plan:   Informed Consent: I have reviewed the patients History and Physical, chart, labs and discussed the  procedure including the risks, benefits and alternatives for the proposed anesthesia with the patient or authorized representative who has indicated his/her understanding and acceptance.     Dental Advisory Given  Plan Discussed with: CRNA and Surgeon  Anesthesia Plan Comments:         Anesthesia Quick Evaluation

## 2022-01-15 NOTE — Transfer of Care (Signed)
Immediate Anesthesia Transfer of Care Note  Patient: Amber Gibson  Procedure(s) Performed: COLONOSCOPY WITH PROPOFOL  Patient Location: PACU  Anesthesia Type:General  Level of Consciousness: awake and alert   Airway & Oxygen Therapy: Patient Spontanous Breathing  Post-op Assessment: Report given to RN and Post -op Vital signs reviewed and stable  Post vital signs: Reviewed and stable  Last Vitals:  Vitals Value Taken Time  BP    Temp    Pulse    Resp    SpO2      Last Pain:  Vitals:   01/15/22 0906  TempSrc: Temporal  PainSc: 0-No pain         Complications: No notable events documented.

## 2022-01-15 NOTE — Anesthesia Postprocedure Evaluation (Signed)
Anesthesia Post Note  Patient: Amber Gibson  Procedure(s) Performed: COLONOSCOPY WITH PROPOFOL  Patient location during evaluation: PACU Anesthesia Type: General Level of consciousness: awake and oriented Pain management: pain level controlled Vital Signs Assessment: post-procedure vital signs reviewed and stable Respiratory status: spontaneous breathing and respiratory function stable Cardiovascular status: stable Anesthetic complications: no   No notable events documented.   Last Vitals:  Vitals:   01/15/22 1000 01/15/22 1010  BP: 107/65 116/80  Pulse: 77 76  Resp: 12 16  Temp:    SpO2: 100% 100%    Last Pain:  Vitals:   01/15/22 1010  TempSrc:   PainSc: 0-No pain                 VAN STAVEREN,Ethan Clayburn

## 2022-01-16 ENCOUNTER — Encounter: Payer: Self-pay | Admitting: Gastroenterology

## 2022-01-16 LAB — SURGICAL PATHOLOGY

## 2022-01-18 ENCOUNTER — Other Ambulatory Visit: Payer: Self-pay | Admitting: Gastroenterology

## 2022-01-18 DIAGNOSIS — E559 Vitamin D deficiency, unspecified: Secondary | ICD-10-CM

## 2022-04-09 ENCOUNTER — Other Ambulatory Visit: Payer: Self-pay | Admitting: Gastroenterology

## 2022-04-09 DIAGNOSIS — E559 Vitamin D deficiency, unspecified: Secondary | ICD-10-CM

## 2022-05-14 ENCOUNTER — Encounter: Payer: Self-pay | Admitting: Gastroenterology

## 2022-05-14 ENCOUNTER — Ambulatory Visit (INDEPENDENT_AMBULATORY_CARE_PROVIDER_SITE_OTHER): Payer: BC Managed Care – PPO | Admitting: Gastroenterology

## 2022-05-14 VITALS — BP 131/74 | HR 101 | Ht 63.0 in | Wt 120.2 lb

## 2022-05-14 DIAGNOSIS — K5080 Crohn's disease of both small and large intestine without complications: Secondary | ICD-10-CM

## 2022-05-14 DIAGNOSIS — R1013 Epigastric pain: Secondary | ICD-10-CM | POA: Diagnosis not present

## 2022-05-14 DIAGNOSIS — R140 Abdominal distension (gaseous): Secondary | ICD-10-CM | POA: Diagnosis not present

## 2022-05-14 NOTE — Progress Notes (Signed)
Cephas Darby, MD [address]  [place]  [place], [place] [address]  Main: [phone]  Fax: [phone]    Gastroenterology Consultation  Referring Provider:     Mechele Claude, FNP Primary Care Physician:  Mechele Claude, FNP Primary Gastroenterologist:  Dr. Haig Prophet Reason for Consultation:   Small bowel Crohn's        HPI:   Amber Gibson is a 21 y.o. female referred by Dr. Enid Derry, [place], [place]  for consultation & management of small bowel Crohn's.  Patient is currently in clinical and endoscopic remission on biweekly Humira monotherapy.   History of present illness: Tazaria is here for follow-up of Crohn's disease.  She is currently maintained on biweekly Humira.  She underwent colonoscopy in 12/2021 which revealed quiescent colitis, mild chronic colitis without activity in the left colon.  Rest of the colon was unremarkable including terminal ileum.  Her fecal calprotectin levels in March 2023 were normal.  The patient has been experiencing intermittent episodes of upper abdominal discomfort associated with abdominal bloating and bouts of diarrhea especially when she is stressed out at work and depending on what she eats, especially greasy foods.  The symptoms occur about every other week and generally last for 2 days, first day is bad.  She is also not able to finish her meal.  She lost about 7 pounds since last visit.  She denies any rectal bleeding.  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  She was originally seen by Dr. Joselyn Arrow it [facility] gastroenterology office.  She underwent upper endoscopy and colonoscopy in 10/2020 which revealed scant, scattered aphthous ulcers in the terminal ileum.  Pathology results revealed acute ileitis only with no evidence of chronicity. Prior to her endoscopic evaluation, patient was on short course of prednisone with short course of Flagyl by her PCP as her ASCA antibodies came back positive, presuming that she has Crohn's disease.  Based on the pathology results and her symptoms, she was started on short course of prednisone by Dr. Haig Prophet due to high index of clinical suspicion for Crohn's ileitis.  Patient reports that the steroids did not provide any significant relief of her symptoms. She has been gradually losing weight but not at her prepregnancy weight. She denied any GI symptoms during her pregnancy.  She does report significant abdominal bloating as well.  Subsequently, she underwent video capsule endoscopy in 12/2020, found to have several aphthous ulcers scattered throughout the distal small bowel.  Therefore, given that she also has elevated fecal calprotectin levels to 127 and no infectious etiology found on stool studies, I have started her on Entocort 9 mg daily along with Apriso 0.75 mg twice daily in 01/2021.  Patient reports that the episodes of vomiting and diarrhea have almost resolved. Patient developed flareup upon tapering budesonide.  She went back to 9 mg daily which provided complete relief, she decreased to 6 mg daily along with low-dose Apriso. Patient started Humira in mid October 2022, standard induction dose followed by maintenance every 2 weeks.  After  1 month of starting Humira, patient noticed to have mild injection site reaction which was swelling and redness.  She also noticed to have tender bumps in her underarms or neck which she felt like boils with greenish drainage from the bump.  She  contacted me about these lesions on my chart, I gave her 4 weeks of doxycycline 100 mg twice daily with resolution of lesions.  Her fecal calprotectin levels returned normal in 07/2021.  Humira trough level 14, undetectable antibodies as of 08/07/2021   Extra intestinal manifestations: Hidradenitis s/p treatment with doxycycline  IBD surgical history: None  Imaging:  MRE none  CTE none   SBFT none  Procedures:  EGD and colonoscopy 11/24/2020 EGD normal, gastric biopsies were performed Colonoscopy revealed normal colon, aphthous ulcers in the terminal ileum, biopsies performed Video capsule endoscopy 12/21/2020 Capsule study was incomplete as capsule was retained in stomach for 3 hours 44 minutes.  Images were not captured beyond 8-hour time period.  Within the limited study, there were several aphthous ulcers scattered throughout the distal small bowel.  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  There is no history of NSAID use  Colonoscopy 01/15/2022 - The examined portion of the ileum was normal. Biopsied. - The entire examined colon is normal. Biopsied. Clip (MR conditional) was placed. - The distal rectum and anal verge are normal on retroflexion view. - Non-bleeding external hemorrhoids.  DIAGNOSIS:  A. TERMINAL ILEUM; COLD BIOPSY:  - ENTERIC MUCOSA WITH NORMAL VILLOUS ARCHITECTURE AND REACTIVE LYMPHOID  HYPERPLASIA.  - NEGATIVE FOR ACTIVE MUCOSAL ENTERITIS.  - NEGATIVE FOR GRANULOMA, DYSPLASIA, AND MALIGNANCY.   B. COLON, RIGHT; COLD BIOPSY:  - BENIGN COLONIC MUCOSA WITH NO SIGNIFICANT HISTOPATHOLOGIC CHANGE.  - NEGATIVE FOR ACTIVE MUCOSAL COLITIS AND ARCHITECTURAL CHANGES OF  CHRONICITY.  - NEGATIVE FOR GRANULOMA, DYSPLASIA, AND MALIGNANCY.   C. COLON, LEFT; COLD BIOPSY:  - MILD CHRONIC COLITIS WITHOUT ACTIVITY.  - NEGATIVE FOR GRANULOMA, DYSPLASIA, AND MALIGNANCY.   IBD medications:  Steroids: Did not respond to prednisone, responded well to  budesonide x 2 courses in the past 5-ASA: Tried Apriso prior to initiation of Humira with no response Immunomodulators: AZA, methotrexate TPMT status normal Biologics:  Anti TNFs: Humira monotherapy initiated in mid October 2022 Undetectable antibodies, therapeutic trough levels in 07/2021 Anti Integrins: Ustekinumab: Tofactinib: Clinical trial:  She does not smoke tobacco, denies alcohol use, denies marijuana use or IV drug abuse  NSAIDs: None  Antiplts/Anticoagulants/Anti thrombotics: None    Past Medical History:  Diagnosis Date   Crohn disease ([place])    Menometrorrhagia    Orthostatic proteinuria    Vitamin D deficiency     Past Surgical History:  Procedure Laterality Date   COLONOSCOPY WITH PROPOFOL N/A 01/15/2022   Procedure: COLONOSCOPY WITH PROPOFOL;  Surgeon: Lin Landsman, MD;  Location: [facility] ENDOSCOPY;  Service: Gastroenterology;  Laterality: N/A;   GIVENS CAPSULE STUDY N/A 12/21/2020   Procedure: GIVENS CAPSULE STUDY;  Surgeon: Lin Landsman, MD;  Location: [facility] ENDOSCOPY;  Service: Gastroenterology;  Laterality: N/A;   NO PAST SURGERIES      Current Outpatient Medications:    etonogestrel-ethinyl estradiol (NUVARING) 0.12-0.015 MG/24HR vaginal ring, , Disp: , Rfl:    HUMIRA PEN 40 MG/0.4ML PNKT, INJECT 1 PEN UNDER THE SKIN EVERY 14 DAYS., Disp: 2 each, Rfl: 5   promethazine (PHENERGAN) 25 MG tablet, TAKE 1/2 TO 1 TABLET BY MOUTH UP TO THREE TIMES DAILY AS NEEDED FOR NAUSEA, Disp: , Rfl:  Family History  Problem Relation Age of Onset   Lupus Maternal Grandmother    Diabetes Maternal Grandmother    Heart disease Maternal Grandmother    Hypertension Maternal Grandmother    COPD Maternal Grandmother    Diabetes Maternal Grandfather    Heart disease Maternal Grandfather    Hypertension Maternal Grandfather    Cancer Neg Hx    Stroke Neg Hx      Social History   Tobacco Use   Smoking status: Never   Smokeless tobacco: Never  Vaping Use    Vaping Use: Never used  Substance Use Topics   Alcohol use: No    Alcohol/week: 0.0 standard drinks of alcohol   Drug use: No    Allergies as of 05/14/2022 - Review Complete 05/14/2022  Allergen Reaction Noted   Pineapple  01/21/2020    Review of Systems:    All systems reviewed and negative except where noted in HPI.   Physical Exam:  BP 131/74 (BP Location: Right Arm, Patient Position: Sitting, Cuff Size: Normal)   Pulse (!) 101   Ht 5' 3"  (1.6 m)   Wt 120 lb 3.2 oz (54.5 kg)   BMI 21.29 kg/m  No LMP recorded. (Menstrual status: Other).  General:   Alert,  Well-developed, well-nourished, pleasant and cooperative in NAD Head:  Normocephalic and atraumatic. Eyes:  Sclera clear, no icterus.   Conjunctiva pink. Ears:  Normal auditory acuity. Nose:  No deformity, discharge, or lesions. Mouth:  No deformity or lesions,oropharynx pink & moist. Neck:  Supple; no masses or thyromegaly. Lungs:  Respirations even and unlabored.  Clear throughout to auscultation.   No wheezes, crackles, or rhonchi. No acute distress. Heart:  Regular rate and rhythm; no murmurs, clicks, rubs, or gallops. Abdomen:  Normal bowel sounds. Soft, non-tender and mildly distended, tympanic to percussion without masses, hepatosplenomegaly or hernias noted.  No guarding or rebound tenderness.   Rectal: Not performed Msk:  Symmetrical without gross deformities. Good, equal movement & strength bilaterally. Pulses:  Normal pulses noted. Extremities:  No clubbing or edema.  No cyanosis. Neurologic:  Alert and oriented x3;  grossly normal neurologically. Skin:  Intact without significant lesions or rashes. No jaundice. Psych:  Alert and cooperative. Normal mood and affect.  Imaging Studies: None  Assessment and Plan:   Donn Wilmot is a 21 y.o. African-American female with small bowel Crohn's diagnosed in 10/2020, not stricturing, nonpenetrating phenotype, history of hidradenitis   Small bowel  Crohn's, diagnosed in 10/2020, nonstricturing, nonpenetrating phenotype Previously on budesonide and Apriso  Initiated Humira monotherapy in 05/2021, currently in clinical and histologic remission based on the colonoscopy from 12/2021 Undetectable Humira antibodies, therapeutic drug levels as of 12/22 Fecal calprotectin levels normalized in 10/2021 Recheck CBC, LFTs today  Abdominal bloating, early satiety, epigastric discomfort and bouts of nonbloody diarrhea Check H. pylori breath test, if negative, recommend EGD with gastric biopsies to evaluate for upper GI Crohn's.  If this is also unremarkable, recommend gastric emptying study, empiric trial of rifaximin for possible bacterial overgrowth or MR enterography.  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 Anemia: None, no evidence of iron deficiency or B12 deficiency 3.Immunizations: Hep A and B vaccinated, Influenza annual influenza vaccine, up-to-date, prevnar received, pneumovax 9/22, Varicella immunized, Zoster first dose of Shingrix in end of September 2022,  recommend second Shingrix vaccine 4.Cancer screening I) Colon cancer/dysplasia surveillance: None II) Cervical cancer: Pap smear up-to-date III) Skin cancer - counseled about annual skin exam by dermatology and skin protection in summer using sun screen SPF > 50, clothing 5.Bone health Vitamin D status: Has mild to moderate vitamin D deficiency, s/p replacement Recheck levels today Bone density testing: Not done 5. Labs: Every 3 months 6. Smoking: None 7. NSAIDs and Antibiotics use: None   Follow up in 6 months or sooner based on the above work-up   Cephas Darby, MD

## 2022-05-15 LAB — CBC WITH DIFFERENTIAL/PLATELET
Basophils Absolute: 0.1 10*3/uL (ref 0.0–0.2)
Basos: 1 %
EOS (ABSOLUTE): 0.3 10*3/uL (ref 0.0–0.4)
Eos: 3 %
Hematocrit: 37.8 % (ref 34.0–46.6)
Hemoglobin: 12.9 g/dL (ref 11.1–15.9)
Immature Grans (Abs): 0 10*3/uL (ref 0.0–0.1)
Immature Granulocytes: 0 %
Lymphocytes Absolute: 3 10*3/uL (ref 0.7–3.1)
Lymphs: 36 %
MCH: 28.1 pg (ref 26.6–33.0)
MCHC: 34.1 g/dL (ref 31.5–35.7)
MCV: 82 fL (ref 79–97)
Monocytes Absolute: 0.8 10*3/uL (ref 0.1–0.9)
Monocytes: 10 %
Neutrophils Absolute: 4.2 10*3/uL (ref 1.4–7.0)
Neutrophils: 50 %
Platelets: 308 10*3/uL (ref 150–450)
RBC: 4.59 x10E6/uL (ref 3.77–5.28)
RDW: 13 % (ref 11.7–15.4)
WBC: 8.4 10*3/uL (ref 3.4–10.8)

## 2022-05-15 LAB — HEPATIC FUNCTION PANEL
ALT: 16 IU/L (ref 0–32)
AST: 15 IU/L (ref 0–40)
Albumin: 4.5 g/dL (ref 4.0–5.0)
Alkaline Phosphatase: 51 IU/L (ref 44–121)
Bilirubin Total: 0.3 mg/dL (ref 0.0–1.2)
Bilirubin, Direct: 0.1 mg/dL (ref 0.00–0.40)
Total Protein: 7.5 g/dL (ref 6.0–8.5)

## 2022-05-15 LAB — H. PYLORI BREATH TEST: H pylori Breath Test: NEGATIVE

## 2022-05-17 ENCOUNTER — Telehealth: Payer: Self-pay

## 2022-05-17 ENCOUNTER — Other Ambulatory Visit: Payer: Self-pay

## 2022-05-17 DIAGNOSIS — R1013 Epigastric pain: Secondary | ICD-10-CM

## 2022-05-17 DIAGNOSIS — R140 Abdominal distension (gaseous): Secondary | ICD-10-CM

## 2022-05-17 NOTE — Telephone Encounter (Signed)
-----   Message from Lin Landsman, MD sent at 05/17/2022 10:14 AM EDT ----- Recommend EGD given negative H Pylori breath test and ongoing upper GI symptoms, weight loss  Dx: dyspepsia  RV

## 2022-05-17 NOTE — Telephone Encounter (Signed)
Patient verbalized understanding of results. Schedule EGD for 06/08/2022. Went over instructions, sent to [employer] and mailed

## 2022-06-07 ENCOUNTER — Encounter: Payer: Self-pay | Admitting: Gastroenterology

## 2022-06-08 ENCOUNTER — Ambulatory Visit
Admission: RE | Admit: 2022-06-08 | Discharge: 2022-06-08 | Disposition: A | Discharge status: Home / Self Care | Payer: BC Managed Care – PPO | Attending: Gastroenterology | Admitting: Gastroenterology

## 2022-06-08 ENCOUNTER — Encounter
Admission: RE | Disposition: A | Discharge status: Home / Self Care | Payer: Self-pay | Source: Home / Self Care | Attending: Gastroenterology

## 2022-06-08 ENCOUNTER — Ambulatory Visit: Payer: BC Managed Care – PPO | Admitting: Registered Nurse

## 2022-06-08 DIAGNOSIS — K5090 Crohn's disease, unspecified, without complications: Secondary | ICD-10-CM | POA: Diagnosis present

## 2022-06-08 DIAGNOSIS — R140 Abdominal distension (gaseous): Secondary | ICD-10-CM

## 2022-06-08 DIAGNOSIS — R634 Abnormal weight loss: Secondary | ICD-10-CM | POA: Diagnosis not present

## 2022-06-08 DIAGNOSIS — R1013 Epigastric pain: Secondary | ICD-10-CM | POA: Insufficient documentation

## 2022-06-08 HISTORY — PX: ESOPHAGOGASTRODUODENOSCOPY (EGD) WITH PROPOFOL: SHX5813

## 2022-06-08 LAB — POCT PREGNANCY, URINE: Preg Test, Ur: NEGATIVE

## 2022-06-08 SURGERY — ESOPHAGOGASTRODUODENOSCOPY (EGD) WITH PROPOFOL
Anesthesia: General

## 2022-06-08 MED ORDER — DIPHENHYDRAMINE HCL 50 MG/ML IJ SOLN
INTRAMUSCULAR | Status: DC | PRN
  Administered 2022-06-08: 10 mg via INTRAVENOUS

## 2022-06-08 MED ORDER — DIPHENHYDRAMINE HCL 50 MG/ML IJ SOLN
INTRAMUSCULAR | Status: AC
  Filled 2022-06-08: qty 1

## 2022-06-08 MED ORDER — DEXMEDETOMIDINE HCL 200 MCG/2ML IV SOLN
INTRAVENOUS | Status: DC | PRN
  Administered 2022-06-08: 12 ug via INTRAVENOUS

## 2022-06-08 MED ORDER — PROPOFOL 500 MG/50ML IV EMUL
INTRAVENOUS | Status: DC | PRN
  Administered 2022-06-08: 206.801 ug/kg/min via INTRAVENOUS

## 2022-06-08 MED ORDER — PROPOFOL 10 MG/ML IV BOLUS
INTRAVENOUS | Status: DC | PRN
  Administered 2022-06-08: 90 mg via INTRAVENOUS
  Administered 2022-06-08: 40 mg via INTRAVENOUS

## 2022-06-08 MED ORDER — SODIUM CHLORIDE 0.9 % IV SOLN
INTRAVENOUS | Status: DC
  Administered 2022-06-08: 20 mL/h via INTRAVENOUS

## 2022-06-08 MED ORDER — LIDOCAINE HCL (CARDIAC) PF 100 MG/5ML IV SOSY
PREFILLED_SYRINGE | INTRAVENOUS | Status: DC | PRN
  Administered 2022-06-08: 100 mg via INTRAVENOUS

## 2022-06-08 MED ORDER — GLYCOPYRROLATE 0.2 MG/ML IJ SOLN
INTRAMUSCULAR | Status: DC | PRN
  Administered 2022-06-08: .1 mg via INTRAVENOUS

## 2022-06-08 NOTE — Anesthesia Postprocedure Evaluation (Signed)
Anesthesia Post Note  Patient: Amber Gibson  Procedure(s) Performed: ESOPHAGOGASTRODUODENOSCOPY (EGD) WITH PROPOFOL  Patient location during evaluation: Endoscopy Anesthesia Type: General Level of consciousness: awake and alert Pain management: pain level controlled Vital Signs Assessment: post-procedure vital signs reviewed and stable Respiratory status: spontaneous breathing, nonlabored ventilation, respiratory function stable and patient connected to nasal cannula oxygen Cardiovascular status: blood pressure returned to baseline and stable Postop Assessment: no apparent nausea or vomiting Anesthetic complications: no   No notable events documented.   Last Vitals:  Vitals:   06/08/22 1033 06/08/22 1043  BP: 105/69 119/80  Pulse: 86 73  Resp: (!) 21 19  Temp: (!) 36 C   SpO2: 100% 100%    Last Pain:  Vitals:   06/08/22 1043  TempSrc:   PainSc: 0-No pain                 Dimas Millin

## 2022-06-08 NOTE — Op Note (Signed)
[facility] Gastroenterology Patient Name: Amber Gibson Procedure Date: 06/08/2022 10:08 AM MRN: [id number] Account #: [id number] Date of Birth: 02/21/2001 Admit Type: Outpatient Age: 21 Room: [facility] ENDO ROOM 2 Gender: Female Note Status: Finalized Instrument Name: Upper Endoscope [id number] Procedure:             Upper GI endoscopy Indications:           Dyspepsia, Crohn's disease, Weight loss Providers:             Lin Landsman MD, MD Referring MD:          No Local Md, MD (Referring MD) Medicines:             General Anesthesia Complications:         No immediate complications. Estimated blood loss: None. Procedure:             Pre-Anesthesia Assessment:                        - Prior to the procedure, a History and Physical was                         performed, and patient medications and allergies were                         reviewed. The patient is competent. The risks and                         benefits of the procedure and the sedation options and                         risks were discussed with the patient. All questions                         were answered and informed consent was obtained.                         Patient identification and proposed procedure were                         verified by the physician, the nurse, the                         anesthesiologist, the anesthetist and the technician                         in the pre-procedure area in the procedure room in the                         endoscopy suite. Mental Status Examination: alert and                         oriented. Airway Examination: normal oropharyngeal                         airway and neck mobility. Respiratory Examination:                         clear to auscultation. CV Examination: normal.  Prophylactic Antibiotics: The patient does not require                         prophylactic antibiotics. Prior Anticoagulants: The                          patient has taken no previous anticoagulant or                         antiplatelet agents. ASA Grade Assessment: II - A                         patient with mild systemic disease. After reviewing                         the risks and benefits, the patient was deemed in                         satisfactory condition to undergo the procedure. The                         anesthesia plan was to use general anesthesia.                         Immediately prior to administration of medications,                         the patient was re-assessed for adequacy to receive                         sedatives. The heart rate, respiratory rate, oxygen                         saturations, blood pressure, adequacy of pulmonary                         ventilation, and response to care were monitored                         throughout the procedure. The physical status of the                         patient was re-assessed after the procedure.                        After obtaining informed consent, the endoscope was                         passed under direct vision. Throughout the procedure,                         the patient's blood pressure, pulse, and oxygen                         saturations were monitored continuously. The Endoscope                         was introduced through the mouth, and advanced to the  second part of duodenum. The upper GI endoscopy was                         accomplished without difficulty. The patient tolerated                         the procedure well. Findings:      The duodenal bulb and second portion of the duodenum were normal.       Biopsies were taken with a cold forceps for histology.      The entire examined stomach was normal. Biopsies were taken with a cold       forceps for histology.      The cardia and gastric fundus were normal on retroflexion.      Esophagogastric landmarks were identified: the Z-line was found at 36 cm        from the incisors.      The gastroesophageal junction and examined esophagus were normal. Impression:            - Normal duodenal bulb and second portion of the                         duodenum. Biopsied.                        - Normal stomach. Biopsied.                        - Esophagogastric landmarks identified.                        - Normal gastroesophageal junction and esophagus. Recommendation:        - Await pathology results.                        - Discharge patient to home (with escort).                        - Resume previous diet today.                        - Do a gastric emptying study at appointment to be                         scheduled. Procedure Code(s):     --- Professional ---                        [phone], Esophagogastroduodenoscopy, flexible,                         transoral; with biopsy, single or multiple Diagnosis Code(s):     --- Professional ---                        R10.13, Epigastric pain                        K50.90, Crohn's disease, unspecified, without                         complications  R63.4, Abnormal weight loss CPT copyright 2019 American Medical Association. All rights reserved. The codes documented in this report are preliminary and upon coder review may  be revised to meet current compliance requirements. Dr. Ulyess Mort Lin Landsman MD, MD 06/08/2022 10:30:59 AM This report has been signed electronically. Number of Addenda: 0 Note Initiated On: 06/08/2022 10:08 AM Estimated Blood Loss:  Estimated blood loss: none.      [facility]

## 2022-06-08 NOTE — Transfer of Care (Signed)
Immediate Anesthesia Transfer of Care Note  Patient: Amber Gibson  Procedure(s) Performed: ESOPHAGOGASTRODUODENOSCOPY (EGD) WITH PROPOFOL  Patient Location: Endoscopy Unit  Anesthesia Type:General  Level of Consciousness: drowsy  Airway & Oxygen Therapy: Patient Spontanous Breathing  Post-op Assessment: Report given to RN and Post -op Vital signs reviewed and stable  Post vital signs: Reviewed and stable  Last Vitals:  Vitals Value Taken Time  BP 105/69 06/08/22 1033  Temp 36 C 06/08/22 1033  Pulse 86 06/08/22 1033  Resp 21 06/08/22 1033  SpO2 100 % 06/08/22 1033    Last Pain:  Vitals:   06/08/22 1033  TempSrc: Temporal  PainSc: Asleep         Complications: No notable events documented.

## 2022-06-08 NOTE — Anesthesia Preprocedure Evaluation (Signed)
Anesthesia Evaluation  Patient identified by MRN, date of birth, ID band Patient awake    Reviewed: Allergy & Precautions, NPO status , Patient's Chart, lab work & pertinent test results  Airway Mallampati: I  TM Distance: >3 FB Neck ROM: Full    Dental  (+) Teeth Intact, Dental Advidsory Given   Pulmonary neg pulmonary ROS, neg COPD,    Pulmonary exam normal breath sounds clear to auscultation       Cardiovascular Exercise Tolerance: Good (-) angina(-) CAD and (-) Past MI negative cardio ROS Normal cardiovascular exam Rhythm:Regular Rate:Normal     Neuro/Psych negative neurological ROS  negative psych ROS   GI/Hepatic negative GI ROS, Neg liver ROS,   Endo/Other  negative endocrine ROS  Renal/GU negative Renal ROS  negative genitourinary   Musculoskeletal   Abdominal Normal abdominal exam  (+)   Peds negative pediatric ROS (+)  Hematology negative hematology ROS (+) Blood dyscrasia, anemia ,   Anesthesia Other Findings Past Medical History: No date: Crohn disease ([place]) No date: Menometrorrhagia 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 No date: Vitamin D deficiency  Past Surgical History: 12/21/2020: GIVENS CAPSULE STUDY; N/A     Comment:  Procedure: GIVENS CAPSULE STUDY;  Surgeon: Lin Landsman, MD;  Location: [facility] ENDOSCOPY;  Service:               Gastroenterology;  Laterality: N/A; No date: NO PAST SURGERIES  BMI    Body Mass Index: 21.26 kg/m      Reproductive/Obstetrics negative OB ROS                             Anesthesia Physical  Anesthesia Plan  ASA: 2  Anesthesia Plan: General   Post-op Pain Management: Minimal or no pain anticipated   Induction: Intravenous  PONV Risk Score and Plan: 3 and Propofol infusion and TIVA  Airway Management Planned: Natural Airway and Nasal Cannula  Additional Equipment: None  Intra-op Plan:   Post-operative  Plan:   Informed Consent: I have reviewed the patients History and Physical, chart, labs and discussed the procedure including the risks, benefits and alternatives for the proposed anesthesia with the patient or authorized representative who has indicated his/her understanding and acceptance.     Dental Advisory Given  Plan Discussed with: CRNA and Surgeon  Anesthesia Plan Comments: (Discussed risks of anesthesia with patient, including possibility of difficulty with spontaneous ventilation under anesthesia necessitating airway intervention, PONV, and rare risks such as cardiac or respiratory or neurological events, and allergic reactions. Discussed the role of CRNA in patient's perioperative care. Patient understands.)        Anesthesia Quick Evaluation

## 2022-06-08 NOTE — H&P (Signed)
Cephas Darby, MD [address]  [place]  [place], [place] [address]  Main: [phone]  Fax: [phone] Pager: [phone]  Primary Care Physician:  Mechele Claude, FNP Primary Gastroenterologist:  Dr. Cephas Darby  Pre-Procedure History & Physical: HPI:  Amber Gibson is a 21 y.o. female is here for an endoscopy.   Past Medical History:  Diagnosis Date   Crohn disease ([place])    Menometrorrhagia    Orthostatic proteinuria    Vitamin D deficiency     Past Surgical History:  Procedure Laterality Date   COLONOSCOPY WITH PROPOFOL N/A 01/15/2022   Procedure: COLONOSCOPY WITH PROPOFOL;  Surgeon: Lin Landsman, MD;  Location: [facility] ENDOSCOPY;  Service: Gastroenterology;  Laterality: N/A;   GIVENS CAPSULE STUDY N/A 12/21/2020   Procedure: GIVENS CAPSULE STUDY;  Surgeon: Lin Landsman, MD;  Location: [facility] ENDOSCOPY;  Service: Gastroenterology;  Laterality: N/A;    Prior to Admission medications   Medication Sig Start Date End Date Taking? Authorizing Provider  etonogestrel-ethinyl estradiol (NUVARING) 0.12-0.015 MG/24HR vaginal ring  12/24/19  Yes [provider]  HUMIRA PEN 57 MG/0.4ML PNKT INJECT 1 PEN UNDER THE SKIN EVERY 26 DAYS. 12/27/21  Yes Jabre Heo, Tally Due, MD  promethazine (PHENERGAN) 25 MG tablet TAKE 1/2 TO 1 TABLET BY MOUTH UP TO THREE TIMES DAILY AS NEEDED FOR NAUSEA 11/09/20  Yes [provider]    Allergies as of 05/17/2022 - Review Complete 05/14/2022  Allergen Reaction Noted   Pineapple  01/21/2020    Family History  Problem Relation Age of Onset   Lupus Maternal Grandmother    Diabetes Maternal Grandmother    Heart disease Maternal Grandmother    Hypertension Maternal Grandmother    COPD Maternal Grandmother    Diabetes Maternal Grandfather    Heart disease Maternal Grandfather    Hypertension Maternal Grandfather    Cancer Neg Hx    Stroke Neg Hx     Social History   Socioeconomic History    Marital status: Single    Spouse name: Orthoptist   Number of children: Not on file   Years of education: Not on file   Highest education level: Not on file  Occupational History   Not on file  Tobacco Use   Smoking status: Never   Smokeless tobacco: Never  Vaping Use   Vaping Use: Never used  Substance and Sexual Activity   Alcohol use: No    Alcohol/week: 0.0 standard drinks of alcohol   Drug use: No   Sexual activity: Yes    Birth control/protection: None  Other Topics Concern   Not on file  Social History Narrative   Not on file   Social Determinants of Health   Financial Resource Strain: Not on file  Food Insecurity: Not on file  Transportation Needs: Not on file  Physical Activity: Not on file  Stress: Not on file  Social Connections: Not on file  Intimate Partner Violence: Not on file    Review of Systems: See HPI, otherwise negative ROS  Physical Exam: BP 123/82   Pulse 86   Temp (!) 96.8 F (36 C) (Temporal)   Resp 20   Ht 5' 3"  (1.6 m)   Wt 54.4 kg   SpO2 100%   BMI 21.26 kg/m  General:   Alert,  pleasant and cooperative in NAD Head:  Normocephalic and atraumatic. Neck:  Supple; no masses or thyromegaly. Lungs:  Clear throughout to auscultation.    Heart:  Regular  rate and rhythm. Abdomen:  Soft, nontender and nondistended. Normal bowel sounds, without guarding, and without rebound.   Neurologic:  Alert and  oriented x4;  grossly normal neurologically.  Impression/Plan: Amber Gibson is here for an endoscopy to be performed for dyspepsia, weight loss  Risks, benefits, limitations, and alternatives regarding  endoscopy have been reviewed with the patient.  Questions have been answered.  All parties agreeable.   Sherri Sear, MD  06/08/2022, 9:35 AM

## 2022-06-11 ENCOUNTER — Encounter: Payer: Self-pay | Admitting: Gastroenterology

## 2022-06-11 LAB — SURGICAL PATHOLOGY

## 2022-06-12 ENCOUNTER — Telehealth: Payer: Self-pay

## 2022-06-12 DIAGNOSIS — R634 Abnormal weight loss: Secondary | ICD-10-CM

## 2022-06-12 DIAGNOSIS — R1013 Epigastric pain: Secondary | ICD-10-CM

## 2022-06-12 NOTE — Telephone Encounter (Signed)
Patient verbalized understanding of results. She is okay with scheduling gastric emptying study. Do not take promethazine after midnight and nothing to eat or drink after midnight. It is scheduled for 06/22/2022 at 9:30am please arrive at 9:00am at the medical mall [facility]. Called patient back and informed patient and she verbalized understanding

## 2022-06-12 NOTE — Telephone Encounter (Signed)
-----   Message from Lin Landsman, MD sent at 06/11/2022  4:05 PM EDT ----- Pathology results from upper endoscopy came back negative.  Recommend gastric emptying study Dx: Dyspepsia, weight loss  RV

## 2022-06-13 ENCOUNTER — Other Ambulatory Visit: Payer: Self-pay | Admitting: Gastroenterology

## 2022-06-22 ENCOUNTER — Encounter
Admission: RE | Admit: 2022-06-22 | Discharge: 2022-06-22 | Disposition: A | Discharge status: Home / Self Care | Payer: BC Managed Care – PPO | Source: Ambulatory Visit | Attending: Gastroenterology | Admitting: Gastroenterology

## 2022-06-22 DIAGNOSIS — R1013 Epigastric pain: Secondary | ICD-10-CM | POA: Diagnosis present

## 2022-06-22 DIAGNOSIS — R634 Abnormal weight loss: Secondary | ICD-10-CM

## 2022-06-22 MED ORDER — TECHNETIUM TC 99M SULFUR COLLOID
2.1100 | Freq: Once | INTRAVENOUS | Status: AC | PRN
  Administered 2022-06-22: 2.11 via ORAL

## 2022-06-27 ENCOUNTER — Telehealth: Payer: Self-pay

## 2022-06-27 DIAGNOSIS — K5000 Crohn's disease of small intestine without complications: Secondary | ICD-10-CM

## 2022-06-27 NOTE — Telephone Encounter (Signed)
-----   Message from Lin Landsman, MD sent at 06/27/2022 11:08 AM EDT ----- Please inform patient that the gastric emptying study also came back normal.  Because of her ongoing symptoms and weight loss, I recommend MR enterography as the next therapy to evaluate small bowel.  Also, recommend to check fecal calprotectin levels Dx: Small bowel Crohn's  Rohini Vanga

## 2022-06-27 NOTE — Telephone Encounter (Signed)
Called patient and patient verbalized understanding of results. She will come by to pick up stool kit and she is okay with getting MR. Called and got patient schedule for the MRI for 07/13/2022 arrive at 1:30pm for a 3:00pm scan. Nothing to eat or drink 4 hours before the scan. Need to go to the medical mall. Patient verbalized understanding of instructions

## 2022-07-13 ENCOUNTER — Ambulatory Visit
Admission: RE | Admit: 2022-07-13 | Discharge: 2022-07-13 | Disposition: A | Discharge status: Home / Self Care | Payer: BC Managed Care – PPO | Source: Ambulatory Visit | Attending: Gastroenterology | Admitting: Gastroenterology

## 2022-07-13 DIAGNOSIS — K5000 Crohn's disease of small intestine without complications: Secondary | ICD-10-CM | POA: Insufficient documentation

## 2022-07-13 MED ORDER — GADOBUTROL 1 MMOL/ML IV SOLN
5.0000 mL | Freq: Once | INTRAVENOUS | Status: AC | PRN
  Administered 2022-07-13: 5 mL via INTRAVENOUS

## 2022-07-17 ENCOUNTER — Encounter: Payer: Self-pay | Admitting: Gastroenterology

## 2022-07-26 LAB — CALPROTECTIN, FECAL: Calprotectin, Fecal: 96 ug/g (ref 0–120)

## 2022-07-26 NOTE — Telephone Encounter (Signed)
The results are in the system now

## 2022-07-27 IMAGING — CR DG ABDOMEN 1V
2 series · 2 of 2 positions shown · non-contrast
Comparison: No priors.

CLINICAL DATA: 20-year-old female with history of right lower
quadrant abdominal pain. Evaluate for retained capsule.

EXAM:
ABDOMEN - 1 VIEW

[abdomen kub (1 of 2)]
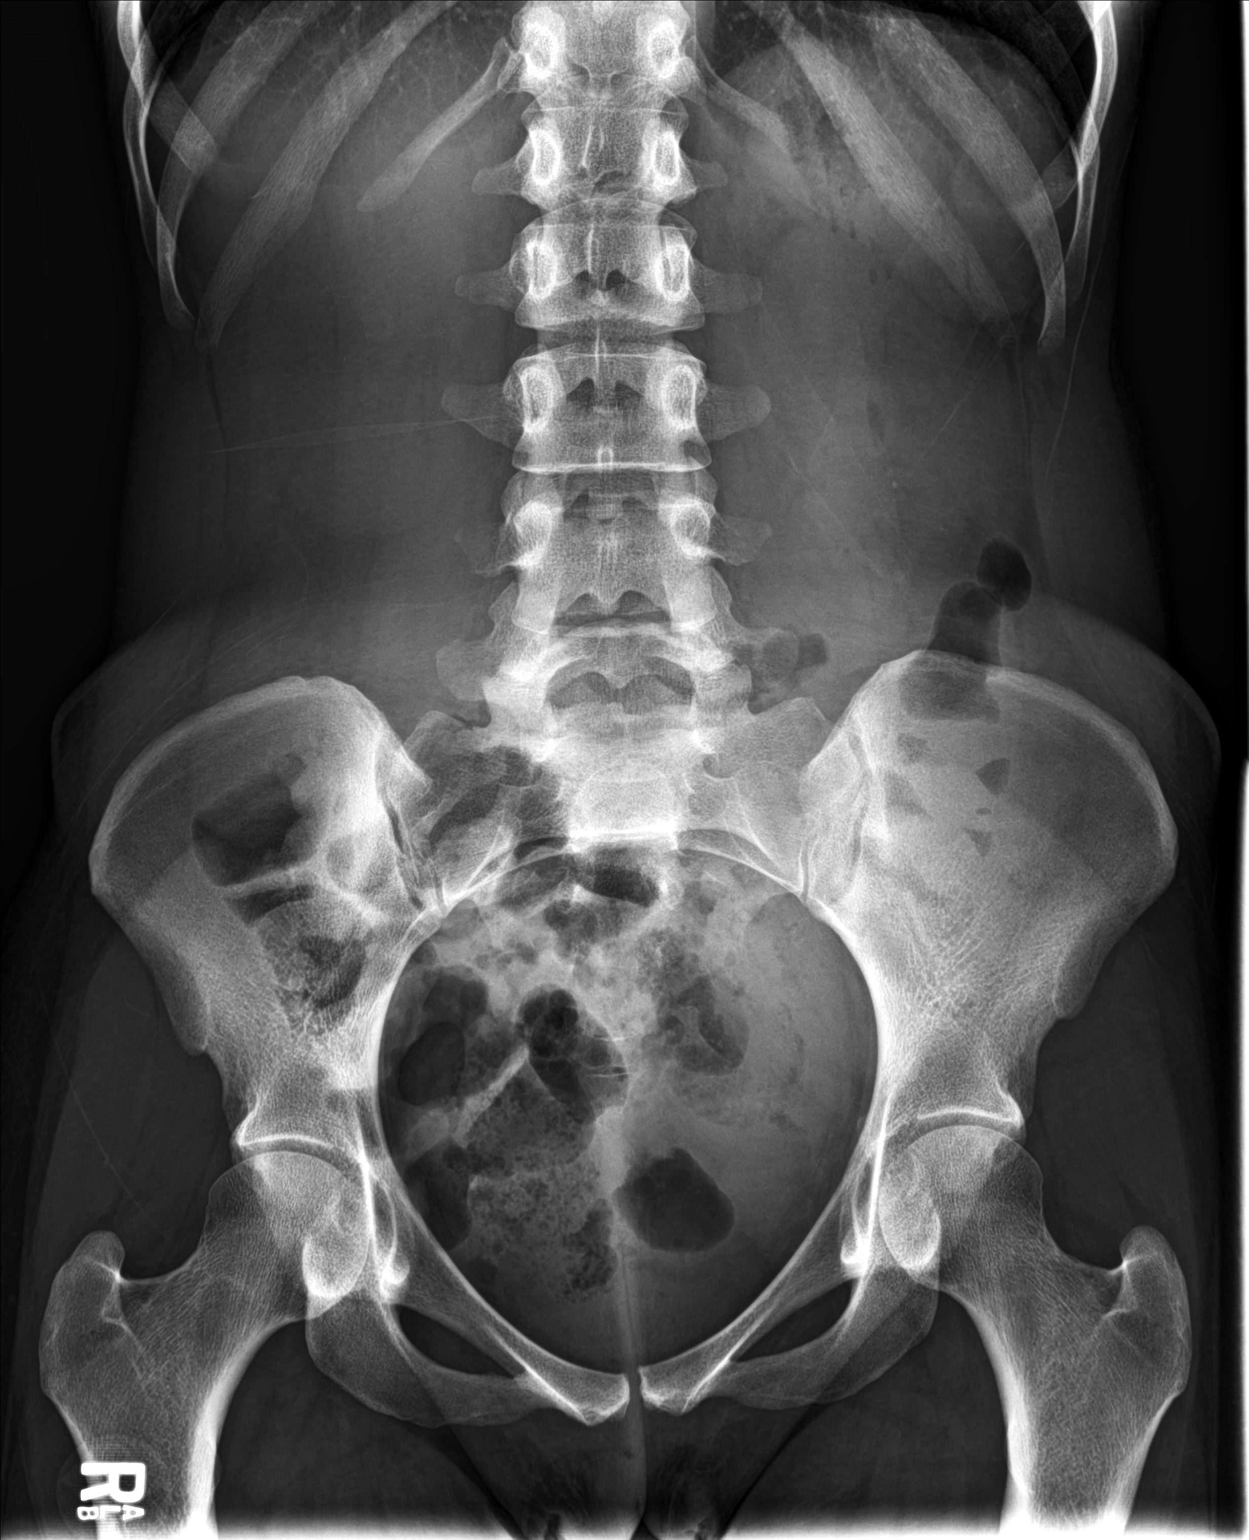

[abdomen kub (2 of 2)]
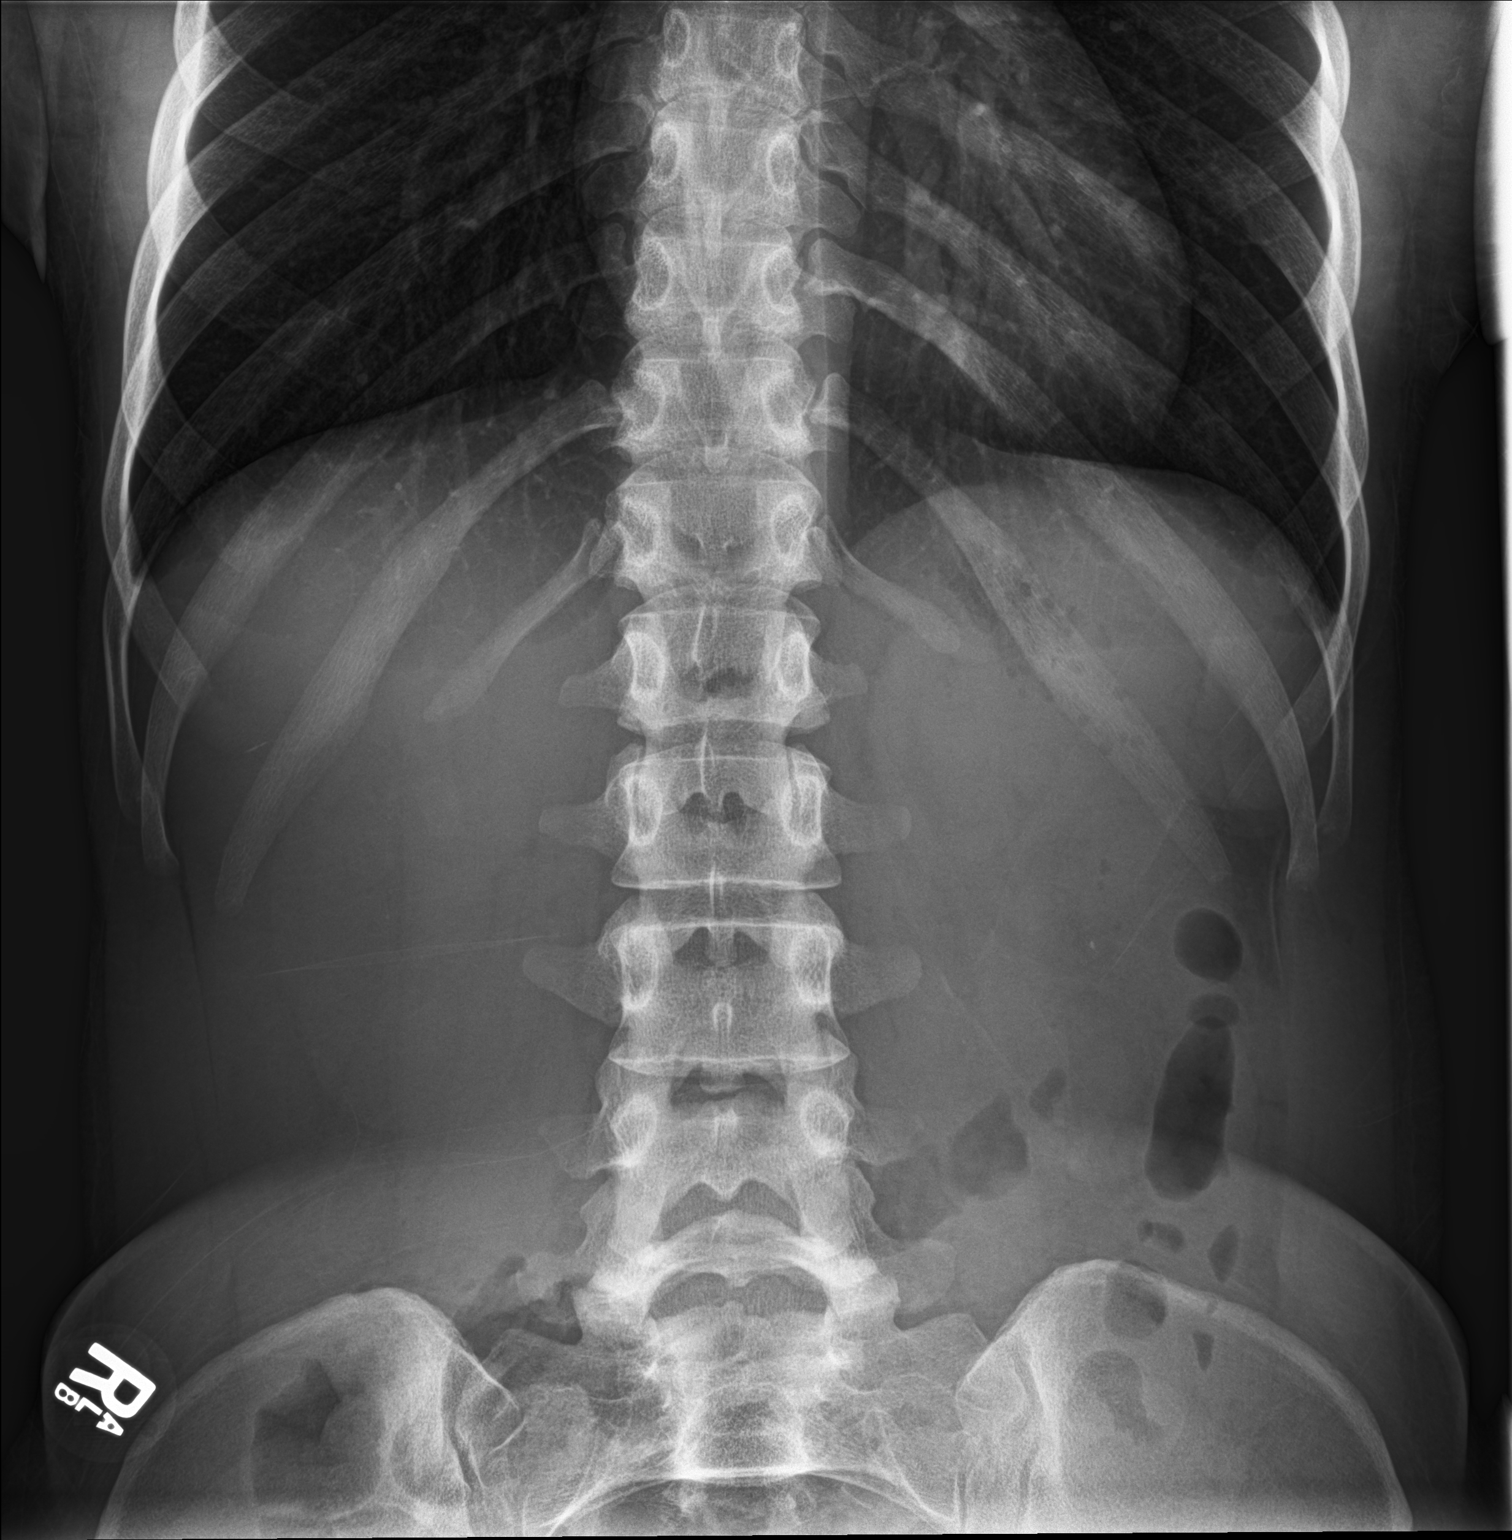

[2 of 2 positions shown; findings below may reference images not displayed]

FINDINGS: The bowel gas pattern is normal. No radio-opaque calculi or other
significant radiographic abnormality are seen.
IMPRESSION: 1. Nonobstructive bowel gas pattern.
2. No pneumoperitoneum.
3. No unexpected radiopaque foreign body.

## 2022-08-02 MED ORDER — PROMETHAZINE HCL 25 MG PO TABS: 25.0000 mg | ORAL_TABLET | Freq: Four times a day (QID) | ORAL | 1 refills | Status: DC | PRN

## 2022-09-27 ENCOUNTER — Telehealth: Payer: Self-pay

## 2022-09-28 NOTE — Telephone Encounter (Signed)
Patient advised by front desk to make appt , appt made -ao

## 2022-10-05 ENCOUNTER — Ambulatory Visit (INDEPENDENT_AMBULATORY_CARE_PROVIDER_SITE_OTHER): Payer: BC Managed Care – PPO | Admitting: Family

## 2022-10-05 VITALS — BP 118/70 | HR 84 | Ht 63.0 in | Wt 117.0 lb

## 2022-10-05 DIAGNOSIS — R769 Abnormal immunological finding in serum, unspecified: Secondary | ICD-10-CM | POA: Diagnosis not present

## 2022-10-05 DIAGNOSIS — L732 Hidradenitis suppurativa: Secondary | ICD-10-CM | POA: Diagnosis not present

## 2022-10-05 DIAGNOSIS — K5000 Crohn's disease of small intestine without complications: Secondary | ICD-10-CM | POA: Diagnosis not present

## 2022-10-06 ENCOUNTER — Encounter: Payer: Self-pay | Admitting: Family

## 2022-10-06 DIAGNOSIS — L732 Hidradenitis suppurativa: Secondary | ICD-10-CM | POA: Insufficient documentation

## 2022-10-06 MED ORDER — DOXYCYCLINE HYCLATE 100 MG PO CAPS: 100.0000 mg | ORAL_CAPSULE | Freq: Every day | ORAL | 0 refills | Status: DC

## 2022-10-06 NOTE — Progress Notes (Signed)
   Acute Office Visit  Subjective:     Patient ID: Amber Gibson, female    DOB: 2000-11-17, 22 y.o.   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  As a result, she was found to have a positive ANA, abnormal DVVT, Alpha Globulin.  She would like to be referred to Rheumatology for further evaluation.   She also says that she has boils that appear in her axillae and in her inguinal folds.  These recur, she has been put on antibiotics previously, and they did go away, but they came right back when the antibiotics were done. They are painful, and she says sometimes they will come to the surface and pop and sometimes they will go away.   No other concerns today.    Patient is in today for  Chief Complaint  Patient presents with   Follow-up    Follow up for referral    Review of Systems  Constitutional:  Positive for malaise/fatigue.  Musculoskeletal:  Positive for myalgias.  Skin:        Lesions in underams and groin.   Neurological:  Positive for dizziness.  All other systems reviewed and are negative.       Objective:    BP 118/70   Pulse 84   Ht 5\' 3"  (1.6 m)   Wt 117 lb (53.1 kg)   SpO2 98%   BMI 20.73 kg/m   Physical Exam Vitals and nursing note reviewed.  Constitutional:      Appearance: Normal appearance. She is normal weight.  Eyes:     Extraocular Movements: Extraocular movements intact.     Pupils: Pupils are equal, round, and reactive to light.  Cardiovascular:     Rate and Rhythm: Normal rate and regular rhythm.  Musculoskeletal:     Cervical back: Normal range of motion.  Skin:    Findings: Lesion present.     Comments: Nodules in axillae and inguinal folds, in various stages of healing, tender to palpation   Neurological:     Mental Status: She  is alert.     No results found for any visits on 10/05/22.      Assessment & Plan:   Problem List Items Addressed This Visit     Crohn's disease of small intestine without complication ([place])   Other Visit Diagnoses     Abnormal immunological finding in serum, unspecified    -  Primary   Will send referral to Rheumatology for her.  Will scan a copy of labs for her.   Relevant Orders   Ambulatory referral to Rheumatology   Hidradenitis suppurativa       Will send in doxycycline for her.  She will let me know if this does not work to clear her current lesions.   Relevant Medications   doxycycline (VIBRAMYCIN) 100 MG capsule        Meds ordered this encounter  Medications   doxycycline (VIBRAMYCIN) 100 MG capsule    Sig: Take 1 capsule (100 mg total) by mouth daily.    Dispense:  90 capsule    Refill:  0    Return in about 3 months (around 01/03/2023).  Total time spent: 20 minutes  Mechele Claude, FNP  10/05/2022

## 2022-10-15 ENCOUNTER — Encounter: Payer: Self-pay | Admitting: Family

## 2022-11-02 ENCOUNTER — Other Ambulatory Visit: Payer: Self-pay | Admitting: Gastroenterology

## 2022-11-05 NOTE — Telephone Encounter (Signed)
Humira is no longer going to be cover on patient insurance 04/01. Please let me know what Biologic you want patient to be on.

## 2022-11-06 ENCOUNTER — Encounter: Payer: Self-pay | Admitting: Gastroenterology

## 2023-01-04 ENCOUNTER — Ambulatory Visit: Payer: BC Managed Care – PPO | Admitting: Family

## 2023-01-11 ENCOUNTER — Ambulatory Visit: Payer: BC Managed Care – PPO | Admitting: Family

## 2023-01-16 ENCOUNTER — Encounter: Payer: BC Managed Care – PPO | Admitting: Internal Medicine

## 2023-04-24 ENCOUNTER — Telehealth: Payer: Self-pay | Admitting: Gastroenterology

## 2023-04-24 MED ORDER — SKYRIZI 180 MG/1.2ML ~~LOC~~ SOCT: 1.0000 | SUBCUTANEOUS | 5 refills | Status: DC

## 2023-04-24 NOTE — Telephone Encounter (Signed)
Called and let [facility] dermat[facility] know that we are changing patient also

## 2023-04-24 NOTE — Telephone Encounter (Signed)
Amber Gibson from [facility] [phone]) requesting call back in ref to switching pts  generic humaria

## 2023-04-24 NOTE — Telephone Encounter (Signed)
Return Charlotte Hall call from [facility] and she states that Dr. Cheree Ditto states that the Generic Humira which is called Hyrimoz is not controlling her crohn's symptoms or psoriasis. Dr. Cheree Ditto wants Dr. Allegra Lai to change patient to Cristy Folks because this will controlled both the crohn's and the psoriasis and they can not prescribe it because they will not do the same dosing she will need for the crohn's.. Called the patient to get patient crohn's symptoms and she states they have improved a little but she is still have flare up's every other week that last 2 days at a time. When she has a flare up she has diarrhea 4 to 5 times a day. Nausea and vomiting. She will vomit 2 to 4 times a day but will take the promethazine and that will help. Denies any abdominal pain or blood in stool.  Patient was due for a follow up appointment because has not been seen since 05/14/2022. Made appointment for patient for 05/22/2023

## 2023-04-24 NOTE — Telephone Encounter (Signed)
Okay to switch to [employer] and stop [employer] dose as below Induction: IV: 600 mg at weeks 0, 4, and 8. Maintenance: SUBQ: Prefilled cartridge: 180 mg at week 12 and every 8 weeks thereafter  Lannette Donath, MD

## 2023-04-24 NOTE — Addendum Note (Signed)
Addended by: Radene Knee L on: 04/24/2023 01:54 PM   Modules accepted: Orders

## 2023-04-24 NOTE — Telephone Encounter (Signed)
Faxed the paper to [facility] and email [name] to let her know that fax was coming her way. Called and informed patient of the change of medication and she will keep taking current medication till she knows medications Is approved and knows when she can get infusion.

## 2023-04-25 ENCOUNTER — Telehealth: Payer: Self-pay

## 2023-04-25 NOTE — Telephone Encounter (Signed)
Amber Gibson from [facility] is calling to see if patient needs the Induction does of the [employer] . They also need to know where the patient will be getting the infusion at if it will be at home a infusion site or at our office.   Return [facility] call and informed them yes she needed the induction dose it would be home infusion she said it did require a PA. Sent a email to [place] with [facility] to find out if they are working on the [place]

## 2023-04-26 ENCOUNTER — Telehealth: Payer: Self-pay

## 2023-04-26 NOTE — Telephone Encounter (Signed)
Emailed [name] with [facility] on the infusion side and she states No it's just for the maintenance part so just disregard for now and we will send to them once the infusions are complete

## 2023-04-26 NOTE — Telephone Encounter (Signed)
She said they would be working on the PA for [employer] they have not notified her about the infusion. I received a fax this morning that patient insurance is out of network for [facility] and she has to go through [employer] building services.

## 2023-04-26 NOTE — Telephone Encounter (Signed)
Documented in other telephone call

## 2023-05-07 ENCOUNTER — Telehealth: Payer: Self-pay

## 2023-05-07 NOTE — Telephone Encounter (Signed)
Per [name] the PA Still pending but they needed either the premed or ana kit to be complete- I'll check on pa later today and see if approved yet- we couldn't submit it until the benefits were updated   Last week they could not start the PA because patient Medicaid and BCBS were both primary. Patient had to call medicaid and tell them that they were secondary. She did this and [facility] has submitted the PA now

## 2023-05-07 NOTE — Telephone Encounter (Signed)
[facility] faxed [place] infusion orders to sign and then also needed patient last TB test faxed to them. Got orders signed and printed the [facility] lab and faxed it with the orders.

## 2023-05-08 NOTE — Telephone Encounter (Signed)
[employer] denied patient Amber Gibson it says on the PA it is only approved when Inffliximab product (such as Avsola or Inflectra) and stelara have been tried and did not work or both were not tolerated.   You can fax appeal to [facility] of [place] appeal department level I  Fax number is [phone]  Sent a email to [name] to see if they will do appeal letter or what do you recommend since she has never been on these preferred medications before.

## 2023-05-09 NOTE — Telephone Encounter (Signed)
Per [name] With [facility] the insurance will require you to call them at [phone] if you do not want her on there formulary medications which are stelara, avsola, or inflectra.

## 2023-05-10 NOTE — Telephone Encounter (Signed)
Scheduled Peer to peer with clinical pharmacist on 19th next week at 9AM  RV

## 2023-05-14 ENCOUNTER — Telehealth: Payer: Self-pay

## 2023-05-14 NOTE — Telephone Encounter (Signed)
Pt has a question about Skyrizi.  [phone]

## 2023-05-14 NOTE — Telephone Encounter (Signed)
Patient states Amber Gibson with [facility] called her to tell her that her insurance company denied the medication and told her to call our office to ask if there was anything she needed to do or what we were going to do. Informed her that Dr. Allegra Lai was going to appeal the denial and has a telephone call set up with her insurance company on Thursday and we would let her know the decision of her insurance after that call. She verbalized understanding

## 2023-05-16 ENCOUNTER — Other Ambulatory Visit: Payer: Self-pay

## 2023-05-16 ENCOUNTER — Telehealth: Payer: Self-pay

## 2023-05-16 DIAGNOSIS — K5000 Crohn's disease of small intestine without complications: Secondary | ICD-10-CM

## 2023-05-16 NOTE — Telephone Encounter (Signed)
Called and left a message for call back. Order the lab and stool test

## 2023-05-16 NOTE — Telephone Encounter (Signed)
-----   Message from [facility] sent at 05/16/2023 10:44 AM EDT ----- Regarding: Labs before next appointment Amber Gibson  Please order adalimumab antibodies and drug levels (this to be done before her next dose) as well as fecal calprotectin levels because of ongoing GI symptoms as reported by the patient I will discuss in detail when I see her next week before I write an appeal letter  RV

## 2023-05-16 NOTE — Telephone Encounter (Signed)
Patient states that her next does of Humira is due on Monday. Informed her to go to a [facility] drawing stating Monday morning before she gives her self the Humira does. She states she will do this. Informed her she also order a stool test and to pick that up when she goes for her lab. She verbalized understanding

## 2023-05-22 ENCOUNTER — Ambulatory Visit (INDEPENDENT_AMBULATORY_CARE_PROVIDER_SITE_OTHER): Payer: BC Managed Care – PPO | Admitting: Gastroenterology

## 2023-05-22 ENCOUNTER — Encounter: Payer: Self-pay | Admitting: Gastroenterology

## 2023-05-22 VITALS — BP 117/78 | HR 80 | Temp 97.8°F | Ht 63.0 in | Wt 123.4 lb

## 2023-05-22 DIAGNOSIS — L409 Psoriasis, unspecified: Secondary | ICD-10-CM

## 2023-05-22 DIAGNOSIS — K5080 Crohn's disease of both small and large intestine without complications: Secondary | ICD-10-CM | POA: Diagnosis not present

## 2023-05-22 DIAGNOSIS — L403 Pustulosis palmaris et plantaris: Secondary | ICD-10-CM | POA: Diagnosis not present

## 2023-05-22 MED ORDER — RIFAXIMIN 550 MG PO TABS
550.0000 mg | ORAL_TABLET | Freq: Three times a day (TID) | ORAL | 2 refills | Status: DC
Start: 2023-05-22 — End: 2023-11-04

## 2023-05-22 MED ORDER — ONDANSETRON HCL 4 MG PO TABS: 4.0000 mg | ORAL_TABLET | Freq: Three times a day (TID) | ORAL | 2 refills | Status: DC | PRN

## 2023-05-22 NOTE — Progress Notes (Signed)
[name], MD [address]  [address]  [place], [place] [address]  Main: [phone]  Fax: [phone]    Gastroenterology Consultation  Referring Provider:     Miki Kins, FNP Primary Care Physician:  Miki Kins, FNP Primary Gastroenterologist:  Dr. Mia Creek Reason for Consultation:   Small bowel Crohn's        HPI:   Amber Gibson is a 22 y.o. female referred by Miki Kins, FNP  for consultation & management of small bowel Crohn's.  Patient is currently in clinical and endoscopic remission on biweekly Humira monotherapy.   Follow-up visit 05/04/2022 Amber Gibson is here for follow-up of Crohn's disease.  She is currently maintained on biweekly Humira.  She underwent colonoscopy in 12/2021 which revealed quiescent colitis, mild chronic colitis without activity in the left colon.  Rest of the colon was unremarkable including terminal ileum.  Her fecal calprotectin levels in March 2023 were normal.  The patient has been experiencing intermittent episodes of upper abdominal discomfort associated with abdominal bloating and bouts of diarrhea especially when she is stressed out at work and depending on what she eats, especially greasy foods.  The symptoms occur about every other week and generally last for 2 days, first day is bad.  She is also not able to finish her meal.  She lost about 7 pounds since last visit.  She denies any rectal bleeding.  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  Her main concern today is poorly controlled psoriasis.  About 4 to 5 months ago, she has developed scalp psoriasis as well as psoriasis in her interdigital areas in bilateral hands, behind ears.  Currently managed on Humira as well as topical steroids by her dermatologist, Dr. Cheree Ditto.  Amber Gibson states that stress triggers her GI symptoms.   She feels bloated constantly, wakes up bloated as well.  She reports having 3-4 bowel movements daily.  She gained about 3 pounds since last visit.  She has been taking Zofran as needed for nausea.  Her EGD was unremarkable, gastric emptying study was normal.  She lost significant amount of hair from scalp psoriasis, has to wait a week.  Patient is accompanied by her mom today.  She is also having flareup of acne.  She has a 5-year-old daughter.  Crohn's disease classification:  Age: 28 to 66 Location: Ileal  Behavior: non stricturing, non penetrating  Perianal: no  IBD diagnosis: Small bowel Crohn's  Disease course: 22 year old African-American female was initially experiencing nonbloody diarrhea and occasionally with mixed with streaks of blood associated with abdominal cramps, epigastric pain associated with nausea and vomiting started end of 2021.  She was originally seen by Dr. Vira Agar it [facility] gastroenterology office.  She underwent upper endoscopy and colonoscopy in 10/2020 which revealed scant, scattered aphthous ulcers in the terminal ileum.  Pathology results revealed acute ileitis only with no evidence of chronicity. Prior to her endoscopic evaluation, patient was on short course of prednisone with short course of Flagyl by her PCP as her ASCA antibodies came back positive, presuming that she has Crohn's disease.  Based on the pathology results and her symptoms, she was started on short course of prednisone by Dr. Mia Creek due to high index of clinical suspicion for Crohn's ileitis.  Patient reports that the steroids did not provide any significant relief  of her symptoms. She has been gradually losing weight but not at her prepregnancy weight. She denied any GI symptoms during her pregnancy.  She does report significant abdominal bloating as well.  Subsequently, she underwent video capsule endoscopy in 12/2020, found to have several aphthous ulcers scattered throughout the  distal small bowel.  Therefore, given that she also has elevated fecal calprotectin levels to 127 and no infectious etiology found on stool studies, I have started her on Entocort 9 mg daily along with Apriso 0.75 mg twice daily in 01/2021.  Patient reports that the episodes of vomiting and diarrhea have almost resolved. Patient developed flareup upon tapering budesonide.  She went back to 9 mg daily which provided complete relief, she decreased to 6 mg daily along with low-dose Apriso. Patient started Humira in mid October 2022, standard induction dose followed by maintenance every 2 weeks.  After 1 month of starting Humira, patient noticed to have mild injection site reaction which was swelling and redness.  She also noticed to have tender bumps in her underarms or neck which she felt like boils with greenish drainage from the bump.  She contacted me about these lesions via my chart, I gave her 4 weeks of doxycycline 100 mg twice daily with resolution of lesions.  Her fecal calprotectin levels returned normal in 07/2021.  Humira trough level 14, undetectable antibodies as of 08/07/2021  Extra intestinal manifestations: Hidradenitis s/p treatment with doxycycline  IBD surgical history: None  Imaging:  MRE 07/13/2022 IMPRESSION: 1. The distal small bowel is normal in caliber and configuration, with some mild mucosal hyperenhancement of the small bowel in the right lower quadrant, suggesting mild inflammation and generally in keeping with reported history of Crohn's disease. There is however no bowel wall thickening or evidence of complicating stricture, obstruction, fistula, or abscess. 2. Some thickened appearing, although underdistended loops of proximal small bowel in the left upper quadrant, however there is no associated mucosal hyperenhancement on postcontrast imaging. No convincing evidence of proximal small bowel inflammation.  CTE none   SBFT none  Procedures:  Upper endoscopy  06/08/2022 DIAGNOSIS:  A. DUODENUM; COLD BIOPSY:  - UNREMARKABLE DUODENAL MUCOSA.  - NEGATIVE FOR GRANULOMAS, DYSPLASIA, AND MALIGNANCY.   B.  STOMACH; COLD BIOPSY:  - UNREMARKABLE GASTRIC MUCOSA.  - NEGATIVE FOR H. PYLORI, INTESTINAL METAPLASIA, GRANULOMAS, DYSPLASIA,  AND MALIGNANCY.   EGD and colonoscopy 11/24/2020 EGD normal, gastric biopsies were performed Colonoscopy revealed normal colon, aphthous ulcers in the terminal ileum, biopsies performed Video capsule endoscopy 12/21/2020 Capsule study was incomplete as capsule was retained in stomach for 3 hours 44 minutes.  Images were not captured beyond 8-hour time period.  Within the limited study, there were several aphthous ulcers scattered throughout the distal small bowel.  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  There is no history of NSAID use  Colonoscopy 01/15/2022 - The examined portion of the ileum was normal. Biopsied. - The entire examined colon is normal. Biopsied. Clip (MR conditional) was placed. - The distal rectum and anal verge are normal on retroflexion view. - Non-bleeding external hemorrhoids.  DIAGNOSIS:  A. TERMINAL ILEUM; COLD BIOPSY:  - ENTERIC MUCOSA WITH NORMAL VILLOUS ARCHITECTURE AND REACTIVE LYMPHOID  HYPERPLASIA.  - NEGATIVE FOR ACTIVE MUCOSAL ENTERITIS.  - NEGATIVE FOR GRANULOMA, DYSPLASIA, AND MALIGNANCY.   B. COLON, RIGHT; COLD BIOPSY:  - BENIGN COLONIC MUCOSA WITH NO SIGNIFICANT HISTOPATHOLOGIC CHANGE.  - NEGATIVE FOR ACTIVE MUCOSAL COLITIS AND ARCHITECTURAL CHANGES OF  CHRONICITY.  -  NEGATIVE FOR GRANULOMA, DYSPLASIA, AND MALIGNANCY.   C. COLON, LEFT; COLD BIOPSY:  - MILD CHRONIC COLITIS WITHOUT ACTIVITY.  - NEGATIVE FOR GRANULOMA, DYSPLASIA, AND MALIGNANCY.   IBD medications:  Steroids: Did not respond to prednisone, responded well to budesonide x 2 courses in the past 5-ASA: Tried Apriso prior to initiation of Humira with no response Immunomodulators: AZA,  methotrexate TPMT status normal Biologics:  Anti TNFs: Humira monotherapy initiated in mid October 2022 Undetectable antibodies, therapeutic trough levels in 07/2021 Anti Integrins: Ustekinumab: Tofactinib: Clinical trial:  She does not smoke tobacco, denies alcohol use, denies marijuana use or IV drug abuse  NSAIDs: None  Antiplts/Anticoagulants/Anti thrombotics: None    Past Medical History:  Diagnosis Date   Crohn disease (HCC)    Menometrorrhagia    Orthostatic proteinuria    Vitamin D deficiency     Past Surgical History:  Procedure Laterality Date   COLONOSCOPY WITH PROPOFOL N/A 01/15/2022   Procedure: COLONOSCOPY WITH PROPOFOL;  Surgeon: Toney Reil, MD;  Location: [facility] ENDOSCOPY;  Service: Gastroenterology;  Laterality: N/A;   ESOPHAGOGASTRODUODENOSCOPY (EGD) WITH PROPOFOL N/A 06/08/2022   Procedure: ESOPHAGOGASTRODUODENOSCOPY (EGD) WITH PROPOFOL;  Surgeon: Toney Reil, MD;  Location: [facility] ENDOSCOPY;  Service: Gastroenterology;  Laterality: N/A;   GIVENS CAPSULE STUDY N/A 12/21/2020   Procedure: GIVENS CAPSULE STUDY;  Surgeon: Toney Reil, MD;  Location: [facility]COPY;  Service: Gastroenterology;  Laterality: N/A;    Current Outpatient Medications:    Clobetasol Propionate 0.05 % shampoo, Apply topically., Disp: , Rfl:    etonogestrel-ethinyl estradiol (NUVARING) 0.12-0.015 MG/24HR vaginal ring, , Disp: , Rfl:    mometasone (ELOCON) 0.1 % cream, Apply 1 Application topically daily., Disp: , Rfl:    ondansetron (ZOFRAN) 4 MG tablet, Take 4 mg by mouth every 8 (eight) hours as needed for nausea or vomiting., Disp: , Rfl:    ondansetron (ZOFRAN) 4 MG tablet, Take 1 tablet (4 mg total) by mouth every 8 (eight) hours as needed for nausea or vomiting., Disp: 30 tablet, Rfl: 2   promethazine (PHENERGAN) 25 MG tablet, Take 1 tablet (25 mg total) by mouth every 6 (six) hours as needed for nausea or vomiting., Disp: 30 tablet, Rfl: 1   rifaximin  (XIFAXAN) 550 MG TABS tablet, Take 1 tablet (550 mg total) by mouth 3 (three) times daily., Disp: 42 tablet, Rfl: 2   Vitamin D, Ergocalciferol, (DRISDOL) 1.25 MG (50000 UNIT) CAPS capsule, Take 50,000 Units by mouth once a week., Disp: , Rfl:    ZORYVE 0.3 % FOAM, Apply topically., Disp: , Rfl:    Risankizumab-rzaa (SKYRIZI) 180 MG/1.2ML SOCT, Inject 1 Pen into the skin every 8 (eight) weeks. (Patient not taking: Reported on 05/22/2023), Disp: 1.2 mL, Rfl: 5   Family History  Problem Relation Age of Onset   Lupus Maternal Grandmother    Diabetes Maternal Grandmother    Heart disease Maternal Grandmother    Hypertension Maternal Grandmother    COPD Maternal Grandmother    Diabetes Maternal Grandfather    Heart disease Maternal Grandfather    Hypertension Maternal Grandfather    Cancer Neg Hx    Stroke Neg Hx      Social History   Tobacco Use   Smoking status: Never   Smokeless tobacco: Never  Vaping Use   Vaping status: Never Used  Substance Use Topics   Alcohol use: No    Alcohol/week: 0.0 standard drinks of alcohol   Drug use: No    Allergies as of 05/22/2023 -  Review Complete 05/22/2023  Allergen Reaction Noted   Pineapple  01/21/2020    Review of Systems:    All systems reviewed and negative except where noted in HPI.   Physical Exam:  BP 117/78 (BP Location: Left Arm, Patient Position: Sitting, Cuff Size: Normal)   Pulse 80   Temp 97.8 F (36.6 C) (Oral)   Ht 5\' 3"  (1.6 m)   Wt 123 lb 6 oz (56 kg)   BMI 21.85 kg/m  No LMP recorded. (Menstrual status: Other).  General:   Alert,  Well-developed, well-nourished, pleasant and cooperative in NAD Head:  Normocephalic and atraumatic. Eyes:  Sclera clear, no icterus.   Conjunctiva pink. Ears:  Normal auditory acuity. Nose:  No deformity, discharge, or lesions. Mouth:  No deformity or lesions,oropharynx pink & moist. Neck:  Supple; no masses or thyromegaly. Lungs:  Respirations even and unlabored.  Clear  throughout to auscultation.   No wheezes, crackles, or rhonchi. No acute distress. Heart:  Regular rate and rhythm; no murmurs, clicks, rubs, or gallops. Abdomen:  Normal bowel sounds. Soft, non-tender and moderately distended, tympanic to percussion without masses, hepatosplenomegaly or hernias noted.  No guarding or rebound tenderness.   Rectal: Not performed Msk:  Symmetrical without gross deformities. Good, equal movement & strength bilaterally. Pulses:  Normal pulses noted. Extremities:  No clubbing or edema.  No cyanosis. Neurologic:  Alert and oriented x3;  grossly normal neurologically. Skin: Scaly rash in interdigital clefts in bilateral palms, behind ears as well as scalp, no jaundice. Psych:  Alert and cooperative. Normal mood and affect.  Imaging Studies: None  Assessment and Plan:   Amber Gibson is a 22 y.o. pleasant African-American female with small bowel Crohn's diagnosed in 10/2020, not stricturing, nonpenetrating phenotype, history of hidradenitis suppurative, diagnosed with psoriasis in 12/2022   Small bowel Crohn's, diagnosed in 10/2020, nonstricturing, nonpenetrating phenotype Previously on budesonide and Apriso  Initiated Humira monotherapy in 05/2021, currently in clinical and histologic remission based on the colonoscopy from 12/2021 Undetectable Humira antibodies, therapeutic drug levels as of 12/22 Fecal calprotectin levels normalized in 10/2021, recheck levels today Discontinue Humira due to 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  I agree with Cristy Folks for treatment of Crohn's along with psoriasis due to concern for unacceptable risk for anti-TNF induced psoriasis  Based on sequence trial which compared to [employer] and Marcy Panning, Cristy Folks is proven to be superior to Stelara with respect to endoscopic remission at week 48 which was statistically significant difference.  Peyrin-Biroulet Jackelyn Knife, Colombel JF, Chalco, D'Haens G, Ferrante M, Schreiber S, Atreya R, Danese S, Fabens, Bossuyt P, Rachel Bo PM, Panaccione R, Cao Q, Neimark E, Wallace K, Anschutz T, Kligys K, Duan WR, Pivorunas V, Earle Gell S, Shu L, Dubinsky M; SEQUENCE Study Group. Risankizumab versus Ustekinumab for Moderate-to-Severe Crohn's Disease. Macy Mis J Med. 2024 Jul 18;391(3):213-223. doi: 10.1056/NEJMoa2314585. PMID: 16109604.  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 Efficacy and safety of risankizumab in moderate-to-severe plaque psoriasis (UltIMMa-1 and UltIMMa-2): results from two double-blind, randomised, placebo-controlled and ustekinumab-controlled phase 3 trials. Lancet. 2018  Aug 25;392(10148):650-661. doi: 10.1016/S0140-6736(18)31713-6. Epub 2018 Aug 7. PMID: 52841324.  Chronic abdominal bloating, early satiety, epigastric discomfort and bouts of nonbloody diarrhea She had EGD with biopsies which were unremarkable.  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 Anemia: None, no evidence of iron deficiency or B12 deficiency 3.Immunizations: Hep A and B vaccinated, Influenza annual influenza vaccine, up-to-date, prevnar received, pneumovax 9/22, Varicella immunized, Zoster first dose of Shingrix in end of September 2022, recommend second Shingrix vaccine 4.Cancer  screening I) Colon cancer/dysplasia surveillance: None II) Cervical cancer: Pap smear up-to-date III) Skin cancer - counseled about annual skin exam by dermatology and skin protection in summer using sun screen SPF > 50, clothing 5.Bone health Vitamin D status: Has mild to moderate vitamin D deficiency, s/p replacement Recheck levels today Bone density testing: Not done 5. Labs: Every 3 months 6. Smoking: None 7. NSAIDs and Antibiotics use: None   Follow up in 6 months or sooner based on the above work-up   [name], MD

## 2023-05-23 ENCOUNTER — Telehealth: Payer: Self-pay

## 2023-05-23 NOTE — Telephone Encounter (Signed)
Faxed expedited appeal to [facility] for the Cristy Folks to fax number [phone] got confirmation fax went through

## 2023-05-25 LAB — CALPROTECTIN, FECAL: Calprotectin, Fecal: 273 ug/g — ABNORMAL HIGH (ref 0–120)

## 2023-05-27 NOTE — Telephone Encounter (Signed)
[employer] denied the appeal for patient Amber Gibson that was appealed faxed in on 05/23/2023. They state they said she must have an inadequate respnse to an Infliximab product an stelara.

## 2023-05-29 ENCOUNTER — Telehealth: Payer: Self-pay

## 2023-05-29 MED ORDER — BUDESONIDE 3 MG PO CPEP: 9.0000 mg | ORAL_CAPSULE | Freq: Every day | ORAL | 3 refills | Status: DC

## 2023-05-29 NOTE — Telephone Encounter (Signed)
Called patient and patient verbalized understanding of instructions. Sent medication to the pharmacy

## 2023-05-29 NOTE — Telephone Encounter (Signed)
Filled out paper work for the [employer] and faxed it to Ivan with [facility]. Also sent her a email to let her know it was coming. Informed patient of this information and she verbalized understanding

## 2023-05-29 NOTE — Telephone Encounter (Signed)
-----   Message from [facility] sent at 05/28/2023 11:59 PM EDT ----- Fecal calprotectin levels are elevated.  Recommend to start budesonide 9 mg daily 1 month with 3 refills until Stelara is approved and started  RV

## 2023-05-31 LAB — SERIAL MONITORING

## 2023-06-04 LAB — ADALIMUMAB+AB (SERIAL MONITOR)
Adalimumab Drug Level: 6 ug/mL
Anti-Adalimumab Antibody: <25 ng/mL

## 2023-06-12 ENCOUNTER — Telehealth: Payer: Self-pay

## 2023-06-12 NOTE — Telephone Encounter (Signed)
Patient insurance has approved patient to have the Stelara through [employer]. Per [name] with [facility] Here's your other one!  Amber Gibson is scheduled for 10/22 with our nurse [facility].

## 2023-06-13 ENCOUNTER — Encounter: Payer: Self-pay | Admitting: Gastroenterology

## 2023-06-24 ENCOUNTER — Encounter: Payer: Self-pay | Admitting: Gastroenterology

## 2023-07-31 ENCOUNTER — Telehealth: Payer: Self-pay

## 2023-07-31 MED ORDER — STELARA 90 MG/ML ~~LOC~~ SOSY: 90.0000 mg | PREFILLED_SYRINGE | SUBCUTANEOUS | 5 refills | Status: DC

## 2023-07-31 NOTE — Telephone Encounter (Signed)
[facility] is out of network for patient insurance for the Stelera 90mg /ML and the patient has to use [facility]. Sent medication to them electronically

## 2023-08-05 ENCOUNTER — Telehealth: Payer: Self-pay

## 2023-08-05 NOTE — Telephone Encounter (Signed)
Received a fax from [facility] that patient medication needed a PA. Did a PA through Cover my meds for patient Amber Gibson 90mg  injections. Waiting on response form insurance company

## 2023-08-05 NOTE — Telephone Encounter (Signed)
PA has been approved by Insurance from 08/05/2023 to 08/04/2024. Called patient pharmacy and informed them that this has been approved by patient insurance.

## 2023-08-09 ENCOUNTER — Telehealth: Payer: Self-pay

## 2023-08-09 ENCOUNTER — Encounter: Payer: Self-pay | Admitting: Family

## 2023-08-09 MED ORDER — SULFAMETHOXAZOLE-TRIMETHOPRIM 800-160 MG PO TABS: 1.0000 | ORAL_TABLET | Freq: Two times a day (BID) | ORAL | 0 refills | Status: DC

## 2023-08-09 NOTE — Telephone Encounter (Signed)
[facility] sent a fax saying they have been trying to contact patient about the  Stelara injection but unable to reach patient. They are closing case till patient calls. Sent mychart message to patient with number

## 2023-08-15 ENCOUNTER — Telehealth: Payer: Self-pay | Admitting: Gastroenterology

## 2023-08-15 NOTE — Addendum Note (Signed)
Addended by: Radene Knee L on: 08/15/2023 12:01 PM   Modules accepted: Orders

## 2023-08-15 NOTE — Telephone Encounter (Signed)
Called [facility] pharmacy at [phone] Called talk to Pharmacy and she states the patient told them she was also on Sotyktu. They state normal patients are not on both medications. They are wanting to make sure she is supposed to be on both medications. Please advise

## 2023-08-15 NOTE — Telephone Encounter (Signed)
Agree, patient cannot be on both Stelara and Sotyktu.  Stelara is also approved for treatment of psoriasis Please check with patient who started her on Sotyktu and she has to discuss, let them know that she is on Stelara as well and find out if they know that she is already on Stelara and why they started Sotyktu  RV

## 2023-08-15 NOTE — Telephone Encounter (Signed)
Amber Gibson called from [facility] called in because they have some questions.

## 2023-08-16 NOTE — Telephone Encounter (Signed)
Patient states Dr. Cheree Ditto her dermatologist started her on the Sotyktu. She states after her first infusion with the Stelera her Psoriasis symptoms were worse. She states she went to see her and she started her on the medication and told her it was safe for her to be on both medications at the same time. Please advise

## 2023-08-16 NOTE — Telephone Encounter (Signed)
Called and left a message for call back  

## 2023-08-16 NOTE — Telephone Encounter (Signed)
Called patient and patient verbalized understanding of instructions. Put a reminder for lab work In January. She states she does want to know once she starts the Steleara Injection does she need to keep taking the Budesonide.

## 2023-08-16 NOTE — Telephone Encounter (Signed)
Called the pharmacy and they will also reach out to Dr. Cheree Ditto office to make sure she is okay she is on both medications.

## 2023-08-28 NOTE — L&D Delivery Note (Signed)
 Delivery Note    Patient Name: Amber Gibson DOB: 21-Dec-2000 MRN: [id number]  Date of admission: 06/21/2024 Delivering MD: Malachi Suderman , Harith Mccadden Date of delivery: 06/21/2024 Type of delivery: SVD  Newborn Data: Live born female  Birth Weight:   APGAR: 7, 9  Newborn Delivery   Birth date/time: 06/21/2024 10:18:00 Delivery type: Vaginal, Spontaneous    Laelia Briana Karim, 23 y.o., @ [redacted]w[redacted]d,  [id number], who was admitted for active labor. I was called to the room when she progressed 3+ station in the second stage of labor, with SROM, clear at this point.  She pushed for 3/min.  She delivered a viable infant, cephalic and restituted to the LOA position over an intact perineum.  A nuchal cord   was not identified. The baby was placed on maternal abdomen while initial step of NRP were perfmored (Dry, Stimulated, and warmed). Hat placed on baby for thermoregulation. Delayed cord clamping was performed for 2 minutes.  Cord double clamped and cut.  Cord cut by FOB. Apgar scores were 7 and 9. Prophylactic IM Pitocin  was started in the third stage of labor for active management. The placenta delivered spontaneously, shultz, circumvallate, with a 3 vessel cord and was sent to LD.  Inspection revealed none. An examination of the vaginal vault and cervix was free from lacerations. The uterus was firm, bleeding stable.   Placenta and umbilical artery blood gas were not sent.  There were no complications during the procedure.  Mom and baby skin to skin following delivery. Left in stable condition.  Maternal Info: Anesthesia: None Episiotomy: no Lacerations:  no Suture Repair: no Est. Blood Loss (mL):   Newborn Info:  Baby Sex: female Circumcision: in pt desired  APGAR (1 MIN): 7  APGAR (5 MINS): 9  APGAR (10 MINS):     Mom to postpartum.  Baby to Couplet care / Skin to Skin.  Delivery Report:    Review the Delivery Report for details.   Alisyn Lequire  CNM, FNP-C, PMHNP-BC  [address]  [place], [place] [address]  Cell: [phone]  Office Phone: [phone] Fax: [phone] 06/21/2024  10:48 AM

## 2023-09-02 ENCOUNTER — Telehealth: Payer: Self-pay

## 2023-09-02 DIAGNOSIS — K5080 Crohn's disease of both small and large intestine without complications: Secondary | ICD-10-CM

## 2023-09-02 NOTE — Telephone Encounter (Signed)
 Order lab work and called patient and left a message for call back

## 2023-09-02 NOTE — Telephone Encounter (Signed)
-----   Message from [facility] sent at 08/16/2023 10:16 AM EST -----  labs drawn every 3 months, next labs in January, CBC, LFTs

## 2023-09-02 NOTE — Telephone Encounter (Signed)
 Patient states she will come for labs this week

## 2023-09-04 LAB — HEPATIC FUNCTION PANEL
ALT: 21 [IU]/L (ref 0–32)
AST: 16 [IU]/L (ref 0–40)
Albumin: 4.4 g/dL (ref 4.0–5.0)
Alkaline Phosphatase: 51 [IU]/L (ref 44–121)
Bilirubin Total: 0.3 mg/dL (ref 0.0–1.2)
Bilirubin, Direct: 0.12 mg/dL (ref 0.00–0.40)
Total Protein: 7.5 g/dL (ref 6.0–8.5)

## 2023-09-04 LAB — CBC WITH DIFFERENTIAL/PLATELET
Basophils Absolute: 0.1 10*3/uL (ref 0.0–0.2)
Basos: 1 %
EOS (ABSOLUTE): 0.3 10*3/uL (ref 0.0–0.4)
Eos: 3 %
Hematocrit: 36.7 % (ref 34.0–46.6)
Hemoglobin: 12.4 g/dL (ref 11.1–15.9)
Immature Grans (Abs): 0 10*3/uL (ref 0.0–0.1)
Immature Granulocytes: 0 %
Lymphocytes Absolute: 2.8 10*3/uL (ref 0.7–3.1)
Lymphs: 35 %
MCH: 28.4 pg (ref 26.6–33.0)
MCHC: 33.8 g/dL (ref 31.5–35.7)
MCV: 84 fL (ref 79–97)
Monocytes Absolute: 0.8 10*3/uL (ref 0.1–0.9)
Monocytes: 10 %
Neutrophils Absolute: 4.1 10*3/uL (ref 1.4–7.0)
Neutrophils: 51 %
Platelets: 368 10*3/uL (ref 150–450)
RBC: 4.36 x10E6/uL (ref 3.77–5.28)
RDW: 12.4 % (ref 11.7–15.4)
WBC: 8 10*3/uL (ref 3.4–10.8)

## 2023-09-06 ENCOUNTER — Telehealth: Payer: Self-pay

## 2023-09-06 DIAGNOSIS — K5080 Crohn's disease of both small and large intestine without complications: Secondary | ICD-10-CM

## 2023-09-06 NOTE — Telephone Encounter (Signed)
-----   Message from [facility] sent at 09/06/2023 11:14 AM EST ----- Labs are normal Recheck CBC and LFTs in 3 months Recommend to recheck fecal calprotectin levels and if these are normal, we can stop budesonide  RV

## 2023-09-06 NOTE — Telephone Encounter (Signed)
 Patient verbalized understanding of results. Put a reminder for 3 months. She will do stool test and go pick up the test today

## 2023-09-19 LAB — CALPROTECTIN, FECAL: Calprotectin, Fecal: 193 ug/g — ABNORMAL HIGH (ref 0–120)

## 2023-09-24 ENCOUNTER — Encounter: Payer: Self-pay | Admitting: Gastroenterology

## 2023-09-24 DIAGNOSIS — K5080 Crohn's disease of both small and large intestine without complications: Secondary | ICD-10-CM

## 2023-10-07 LAB — GI PROFILE, STOOL, PCR

## 2023-10-08 ENCOUNTER — Telehealth: Payer: Self-pay

## 2023-10-08 LAB — C DIFFICILE, CYTOTOXIN B

## 2023-10-08 LAB — C DIFFICILE TOXINS A+B W/RFLX: C difficile Toxins A+B, EIA: NEGATIVE

## 2023-10-08 MED ORDER — CHOLESTYRAMINE 4 G PO PACK: 4.0000 g | PACK | Freq: Every day | ORAL | 1 refills | Status: DC

## 2023-10-08 MED ORDER — BUDESONIDE 3 MG PO CPEP: 9.0000 mg | ORAL_CAPSULE | Freq: Every day | ORAL | 0 refills | Status: DC

## 2023-10-08 NOTE — Telephone Encounter (Signed)
Patient verbalized understanding of results sent medication to the pharmacy. Patient also request a refill on Budesonide. Sent that to the pharmacy also

## 2023-10-08 NOTE — Telephone Encounter (Signed)
-----   Message from [facility] sent at 10/07/2023 10:22 PM EST ----- Stool studies came back negative for infection.  Recommend to continue budesonide 9 mg daily at least for 1 more month and add cholestyramine packet once a day, take separately at least for an hour from other medications  RV

## 2023-10-09 ENCOUNTER — Encounter: Payer: Self-pay | Admitting: Family

## 2023-10-13 ENCOUNTER — Ambulatory Visit
Admission: EM | Admit: 2023-10-13 | Discharge: 2023-10-13 | Disposition: A | Discharge status: Home / Self Care | Payer: 59

## 2023-10-13 ENCOUNTER — Encounter: Payer: Self-pay | Admitting: Emergency Medicine

## 2023-10-13 ENCOUNTER — Emergency Department (HOSPITAL_COMMUNITY): Payer: 59

## 2023-10-13 ENCOUNTER — Emergency Department (HOSPITAL_COMMUNITY)
Admission: EM | Admit: 2023-10-13 | Discharge: 2023-10-13 | Disposition: A | Discharge status: Home / Self Care | Payer: 59 | Attending: Emergency Medicine | Admitting: Emergency Medicine

## 2023-10-13 DIAGNOSIS — L03213 Periorbital cellulitis: Secondary | ICD-10-CM | POA: Diagnosis not present

## 2023-10-13 DIAGNOSIS — L0291 Cutaneous abscess, unspecified: Secondary | ICD-10-CM

## 2023-10-13 DIAGNOSIS — L0201 Cutaneous abscess of face: Secondary | ICD-10-CM

## 2023-10-13 DIAGNOSIS — H02843 Edema of right eye, unspecified eyelid: Secondary | ICD-10-CM | POA: Diagnosis present

## 2023-10-13 LAB — BASIC METABOLIC PANEL
Anion gap: 10 (ref 5–15)
BUN: 16 mg/dL (ref 6–20)
CO2: 21 mmol/L — ABNORMAL LOW (ref 22–32)
Calcium: 10 mg/dL (ref 8.9–10.3)
Chloride: 106 mmol/L (ref 98–111)
Creatinine, Ser: 0.52 mg/dL (ref 0.44–1.00)
GFR, Estimated: >60 mL/min (ref >60)
Glucose, Bld: 82 mg/dL (ref 70–99)
Potassium: 3.8 mmol/L (ref 3.5–5.1)
Sodium: 137 mmol/L (ref 135–145)

## 2023-10-13 LAB — CBC WITH DIFFERENTIAL/PLATELET
Abs Immature Granulocytes: 0.02 10*3/uL (ref 0.00–0.07)
Basophils Absolute: 0 10*3/uL (ref 0.0–0.1)
Basophils Relative: 1 %
Eosinophils Absolute: 0.1 10*3/uL (ref 0.0–0.5)
Eosinophils Relative: 2 %
HCT: 40.1 % (ref 36.0–46.0)
Hemoglobin: 13.1 g/dL (ref 12.0–15.0)
Immature Granulocytes: 0 %
Lymphocytes Relative: 30 %
Lymphs Abs: 2.4 10*3/uL (ref 0.7–4.0)
MCH: 28.1 pg (ref 26.0–34.0)
MCHC: 32.7 g/dL (ref 30.0–36.0)
MCV: 85.9 fL (ref 80.0–100.0)
Monocytes Absolute: 0.7 10*3/uL (ref 0.1–1.0)
Monocytes Relative: 9 %
Neutro Abs: 4.6 10*3/uL (ref 1.7–7.7)
Neutrophils Relative %: 58 %
Platelets: 342 10*3/uL (ref 150–400)
RBC: 4.67 MIL/uL (ref 3.87–5.11)
RDW: 13.3 % (ref 11.5–15.5)
WBC: 8 10*3/uL (ref 4.0–10.5)
nRBC: 0 % (ref 0.0–0.2)

## 2023-10-13 LAB — HCG, SERUM, QUALITATIVE: Preg, Serum: NEGATIVE

## 2023-10-13 LAB — I-STAT CG4 LACTIC ACID, ED: Lactic Acid, Venous: 0.6 mmol/L (ref 0.5–1.9)

## 2023-10-13 MED ORDER — CLINDAMYCIN HCL 300 MG PO CAPS: 300.0000 mg | ORAL_CAPSULE | Freq: Once | ORAL | Status: DC

## 2023-10-13 MED ORDER — IOHEXOL 300 MG/ML  SOLN
75.0000 mL | Freq: Once | INTRAMUSCULAR | Status: AC | PRN
  Administered 2023-10-13: 75 mL via INTRAVENOUS

## 2023-10-13 MED ORDER — LACTATED RINGERS IV BOLUS
1000.0000 mL | Freq: Once | INTRAVENOUS | Status: AC
  Administered 2023-10-13: 1000 mL via INTRAVENOUS

## 2023-10-13 MED ORDER — CLINDAMYCIN HCL 150 MG PO CAPS: 300.0000 mg | ORAL_CAPSULE | Freq: Three times a day (TID) | ORAL | 0 refills | Status: DC

## 2023-10-13 NOTE — Discharge Instructions (Signed)
Please go see the ophthalmologist tomorrow at 9 AM as he is expecting you.  Today your labs are reassuring and your CT scan shows you have periorbital cellulitis with a small abscess that the ophthalmologist will need to evaluate.  Please discontinue use of your Bactrim use the clindamycin we have prescribed for you.  If symptoms change or worsen please return to the ER.

## 2023-10-13 NOTE — ED Triage Notes (Signed)
Patient recently finished her Doxycycline.  Patient currently on Bactrim.  Patient reports ongoing swelling, redness, and discomfort in her right eye for 3 weeks.  Patient  denies fevers.

## 2023-10-13 NOTE — Discharge Instructions (Signed)
-  You seem to have failed almost 3 weeks of oral antibiotics.  At this point you likely need to have a CT scan, possible incision and drainage and IV antibiotics.  Please go to emergency department this time.  You have been advised to follow up immediately in the emergency department for concerning signs.symptoms. If you declined EMS transport, please have a family member take you directly to the ED at this time. Do not delay. Based on concerns about condition, if you do not follow up in th e ED, you may risk poor outcomes including worsening of condition, delayed treatment and potentially life threatening issues. If you have declined to go to the ED at this time, you should call your PCP immediately to set up a follow up appointment.  Go to ED for red flag symptoms, including; fevers you cannot reduce with Tylenol/Motrin, severe headaches, vision changes, numbness/weakness in part of the body, lethargy, confusion, intractable vomiting, severe dehydration, chest pain, breathing difficulty, severe persistent abdominal or pelvic pain, signs of severe infection (increased redness, swelling of an area), feeling faint or passing out, dizziness, etc. You should especially go to the ED for sudden acute worsening of condition if you do not elect to go at this time.

## 2023-10-13 NOTE — ED Triage Notes (Signed)
Pt arrives with c/o right eye pain, swelling. Pt states she has been on Doxycycline and Bactrim for almost 2 weeks, and it is getting worse. Sent here by UC.

## 2023-10-13 NOTE — ED Provider Notes (Signed)
[place] EMERGENCY DEPARTMENT AT [facility] Provider Note   CSN: [id number] Arrival date & time: 10/13/23  1357     History  Chief Complaint  Patient presents with   Eye Pain    Amber Gibson is a 23 y.o. female history of Crohn's presents for right eye swelling for the past 2 weeks.  Patient was initially placed on doxycycline however after 5 days had no improvement and was placed on Bactrim however states that since then the swelling is gotten worse.  Patient states she has been extraocular movements.  Patient denies fevers or vision loss but does state that the swelling is covering her eye and she now swelling in the upper and lower portion of her eye.  Patient denies rubbing her eye or any trauma to the eye.  Home Medications Prior to Admission medications   Medication Sig Start Date End Date Taking? Authorizing Provider  clindamycin (CLEOCIN) 150 MG capsule Take 2 capsules (300 mg total) by mouth every 8 (eight) hours. 10/13/23  Yes Chrissie Dacquisto, Fayrene Fearing T, PA-C  budesonide (ENTOCORT EC) 3 MG 24 hr capsule Take 3 capsules (9 mg total) by mouth daily. 10/08/23   Toney Reil, MD  cholestyramine Lanetta Inch) 4 g packet Take 1 packet (4 g total) by mouth daily. 10/08/23   Toney Reil, MD  Clobetasol Propionate 0.05 % shampoo Apply topically. 01/23/23   [provider]  Deucravacitinib (SOTYKTU) 6 MG TABS Take by mouth.    [provider]  medroxyPROGESTERone (PROVERA) 10 MG tablet Take 10 mg by mouth daily. 07/29/23   [provider]  mometasone (ELOCON) 0.1 % cream Apply 1 Application topically daily. 03/31/23   [provider]  ondansetron (ZOFRAN) 4 MG tablet Take 4 mg by mouth every 8 (eight) hours as needed for nausea or vomiting.    [provider]  ondansetron (ZOFRAN) 4 MG tablet Take 1 tablet (4 mg total) by mouth every 8 (eight) hours as needed for nausea or vomiting. 05/22/23   Vanga, Loel Dubonnet, MD   promethazine (PHENERGAN) 25 MG tablet Take 1 tablet (25 mg total) by mouth every 6 (six) hours as needed for nausea or vomiting. 08/02/22   Toney Reil, MD  rifaximin (XIFAXAN) 550 MG TABS tablet Take 1 tablet (550 mg total) by mouth 3 (three) times daily. 05/22/23   Toney Reil, MD  ustekinumab (STELARA) 90 MG/ML SOSY injection Inject 1 mL (90 mg total) into the skin every 8 (eight) weeks. 07/31/23   Toney Reil, MD  Vitamin D, Ergocalciferol, (DRISDOL) 1.25 MG (50000 UNIT) CAPS capsule Take 50,000 Units by mouth once a week. 04/08/23   [provider]  ZORYVE 0.3 % FOAM Apply topically. 12/11/22   [provider]      Allergies    Pineapple    Review of Systems   Review of Systems  Eyes:  Positive for pain.    Physical Exam Updated Vital Signs BP (!) 141/89   Pulse (!) 117   Temp 98.1 F (36.7 C)   Resp 17   LMP 10/04/2023 (Exact Date)   SpO2 100%  Physical Exam Constitutional:      General: She is not in acute distress. HENT:     Head:     Comments: Right preauricular lymphadenopathy Eyes:     Comments: Vision is grossly intact Pupils PERRL bilaterally Right eye: Erythema with mild fluctuance in the upper portion of the right eye without signs of purulent  drainage along with erythema on the lower portion of the eye as well however no signs of warmth Discomfort with extraocular movements  Neurological:     Mental Status: She is alert.     ED Results / Procedures / Treatments   Labs (all labs ordered are listed, but only abnormal results are displayed) Labs Reviewed  BASIC METABOLIC PANEL - Abnormal; Notable for the following components:      Result Value   CO2 21 (*)    All other components within normal limits  CBC WITH DIFFERENTIAL/PLATELET  HCG, SERUM, QUALITATIVE  I-STAT CG4 LACTIC ACID, ED  I-STAT CG4 LACTIC ACID, ED    EKG None  Radiology CT Orbits W Contrast Result Date: 10/13/2023 CLINICAL DATA:  Provided  history: Periorbital cellulitis, right eye swelling. EXAM: CT ORBITS WITH CONTRAST TECHNIQUE: Multidetector CT images was performed according to the standard protocol following intravenous contrast administration. RADIATION DOSE REDUCTION: This exam was performed according to the departmental dose-optimization program which includes automated exposure control, adjustment of the mA and/or kV according to patient size and/or use of iterative reconstruction technique. CONTRAST:  75mL OMNIPAQUE IOHEXOL 300 MG/ML  SOLN COMPARISON:  None. FINDINGS: Orbits: Right periorbital edema/stranding/enhancement. These findings are compatible with periorbital cellulitis in the appropriate clinical setting. Superimposed centrally hypodense and peripherally enhancing focus measuring 1.5 x 0.8 cm in the right supraorbital region, likely reflecting an abscess (series 9, image 35). No CT evidence of post-septal orbital extension of infection. The globes are normal in size and contour. The extraocular muscles, optic nerve sheath complexes and lacrimal glands are symmetric and unremarkable. Visible paranasal sinuses: No significant paranasal sinus disease. Soft tissues: Right periorbital findings described above. Osseous: No maxillofacial fracture or aggressive osseous lesion. Limited intracranial: No evidence of an acute intracranial abnormality within the field of view. IMPRESSION: Findings compatible with right periorbital cellulitis in the appropriate clinical setting. Superimposed 1.5 x 0.8 cm probable abscess in the right supraorbital region. No CT evidence of post-septal orbital extension of infection. Electronically Signed   By: Jackey Loge D.O.   On: 10/13/2023 17:40    Procedures Procedures    Medications Ordered in ED Medications  clindamycin (CLEOCIN) capsule 300 mg (has no administration in time range)  lactated ringers bolus 1,000 mL (0 mLs Intravenous Stopped 10/13/23 1641)  iohexol (OMNIPAQUE) 300 MG/ML solution  75 mL (75 mLs Intravenous Contrast Given 10/13/23 1655)    ED Course/ Medical Decision Making/ A&P                                 Medical Decision Making Amount and/or Complexity of Data Reviewed Labs: ordered. Radiology: ordered.  Risk Prescription drug management.   Amber Gibson 23 y.o. presented today for eye pain.  Working DDx that I considered at this time includes, but not limited to, preorbital/orbital cellulitis, acute glaucoma, HSV infection, open globe, conjunctivitis, hordeolum/chalazion, FB, CRAO/CRVO.  R/o DDx: orbital cellulitis, acute glaucoma, HSV infection, open globe, conjunctivitis, hordeolum/chalazion, FB, CRAO/CRVO: These are considered less likely due to history of present illness, physical exam, lab/imaging findings.  Review of prior external notes: 11/10/2023 urgent care  Unique Tests and My Independent Interpretation:  CBC: Unremarkable BMP: Unremarkable Blood test: Unremarkable hCG serum qualitative: Negative CT orbits contrast: Periorbital cellulitis with tiny abscess  Social Determinants of Health: none  Discussion with Independent Historian:  Significant other  Discussion of Management of Tests:  Mincey, MD Ophtho  Risk: Medium: prescription drug management  Risk Stratification Score: None  Staffed with Silverio Lay, MD  Plan: On exam patient was in no acute distress was noted be tachycardic at 117.  On exam patient does have erythema and edema of the right eyelid.  Extraocular movements.  Patient also does have right preauricular lymphadenopathy as well.  Patient's been on 2 antibiotics that is failed and was referred here by the urgent care.  Will obtain labs and imaging to rule out orbital cellulitis.  Given patient's tachycardia we will check a lactic and give fluids.  Patient was not endorsing any systemic infectious symptoms and so we will hold off on sepsis workup until we get more information.  Patient's labs are reassuring.  CT scan  shows periorbital cellulitis with a tiny abscess.  I spoke to the ophthalmologist and he recommends switching antibiotics from Bactrim to clindamycin and to follow-up at 9 AM tomorrow.  This was verbalized to the patient.  I spoke to the patient about with the clindamycin to look out for any diarrhea as this could be signs of C. difficile and to return to the ED if this happens.  Patient given first dose of clindamycin here along with work note.  Patient was given return precautions. Patient stable for discharge at this time.  Patient verbalized understanding of plan.  This chart was dictated using voice recognition software.  Despite best efforts to proofread,  errors can occur which can change the documentation meaning.         Final Clinical Impression(s) / ED Diagnoses Final diagnoses:  Periorbital cellulitis of right eye  Abscess    Rx / DC Orders ED Discharge Orders          Ordered    clindamycin (CLEOCIN) 150 MG capsule  Every 8 hours        10/13/23 1812              Netta Corrigan, PA-C 10/13/23 1816    Charlynne Pander, MD 10/13/23 [phone]

## 2023-10-13 NOTE — ED Notes (Signed)
Patient is being discharged from the Urgent Care and sent to the [facility] Emergency Department via private vehicle . Per Eusebio Friendly, PA, patient is in need of higher level of care due to worsening right eye cellulitis. Patient is aware and verbalizes understanding of plan of care.  Vitals:   10/13/23 0903  BP: 116/77  Pulse: 80  Resp: 14  Temp: 98.8 F (37.1 C)  SpO2: 96%

## 2023-10-13 NOTE — ED Provider Notes (Signed)
[facility]    CSN: [id number] Arrival date & time: 10/13/23  4098      History   Chief Complaint Chief Complaint  Patient presents with   Eye Problem    right    HPI Amber Gibson is a 23 y.o. female with history of Crohn's disease.  Today she is presenting for 3-week history of right eye pain and swelling.  Patient sent photos to her primary care provider.  She states she took 5 days of doxycycline and then was switched to Bactrim DS which she has been on for the past 13 days.  She says condition has only worsened and there has been no improvement at all.  Now she reports the eye is almost swollen shut.  She has swelling under the eye that was not there before.  Pain moving eye.  Unable to open eye.  No associated fevers or drainage from eye.  This is the first time patient is being evaluated in person.  No other complaints.  HPI  Past Medical History:  Diagnosis Date   Crohn disease (HCC)    Menometrorrhagia    Orthostatic proteinuria    Vitamin D deficiency     Patient Active Problem List   Diagnosis Date Noted   Hidradenitis suppurativa 10/06/2022   Dyspepsia    Abdominal bloating    Crohn's disease of small intestine without complication (HCC)    Urticaria 01/21/2020   Acne 01/21/2020   SVD (spontaneous vaginal delivery) 09/12/2019   Postpartum care following vaginal delivery 09/12/2019   Indication for care in labor and delivery, antepartum 09/09/2019   Thalassemic syndrome 09/07/2019   Supervision of normal first pregnancy, antepartum 03/04/2019   Menometrorrhagia 07/14/2015    Past Surgical History:  Procedure Laterality Date   COLONOSCOPY WITH PROPOFOL N/A 01/15/2022   Procedure: COLONOSCOPY WITH PROPOFOL;  Surgeon: Toney Reil, MD;  Location: [facility] ENDOSCOPY;  Service: Gastroenterology;  Laterality: N/A;   ESOPHAGOGASTRODUODENOSCOPY (EGD) WITH PROPOFOL N/A 06/08/2022   Procedure: ESOPHAGOGASTRODUODENOSCOPY (EGD) WITH PROPOFOL;   Surgeon: Toney Reil, MD;  Location: [facility] ENDOSCOPY;  Service: Gastroenterology;  Laterality: N/A;   GIVENS CAPSULE STUDY N/A 12/21/2020   Procedure: GIVENS CAPSULE STUDY;  Surgeon: Toney Reil, MD;  Location: [facility] ENDOSCOPY;  Service: Gastroenterology;  Laterality: N/A;    OB History     Gravida  1   Para  1   Term  0   Preterm  1   AB  0   Living  1      SAB  0   IAB  0   Ectopic  0   Multiple  0   Live Births  1            Home Medications    Prior to Admission medications   Medication Sig Start Date End Date Taking? Authorizing Provider  Deucravacitinib (SOTYKTU) 6 MG TABS Take by mouth.   Yes [provider]  sulfamethoxazole-trimethoprim (BACTRIM DS) 800-160 MG tablet Take 1 tablet by mouth 2 (two) times daily. 08/09/23  Yes Miki Kins, FNP  budesonide (ENTOCORT EC) 3 MG 24 hr capsule Take 3 capsules (9 mg total) by mouth daily. 10/08/23   Toney Reil, MD  cholestyramine Lanetta Inch) 4 g packet Take 1 packet (4 g total) by mouth daily. 10/08/23   Toney Reil, MD  Clobetasol Propionate 0.05 % shampoo Apply topically. 01/23/23   [provider]  medroxyPROGESTERone (PROVERA) 10 MG tablet Take 10 mg by  mouth daily. 07/29/23   [provider]  mometasone (ELOCON) 0.1 % cream Apply 1 Application topically daily. 03/31/23   [provider]  ondansetron (ZOFRAN) 4 MG tablet Take 4 mg by mouth every 8 (eight) hours as needed for nausea or vomiting.    [provider]  ondansetron (ZOFRAN) 4 MG tablet Take 1 tablet (4 mg total) by mouth every 8 (eight) hours as needed for nausea or vomiting. 05/22/23   Vanga, Loel Dubonnet, MD  promethazine (PHENERGAN) 25 MG tablet Take 1 tablet (25 mg total) by mouth every 6 (six) hours as needed for nausea or vomiting. 08/02/22   Toney Reil, MD  rifaximin (XIFAXAN) 550 MG TABS tablet Take 1 tablet (550 mg total) by mouth 3 (three) times daily. 05/22/23    Toney Reil, MD  ustekinumab (STELARA) 90 MG/ML SOSY injection Inject 1 mL (90 mg total) into the skin every 8 (eight) weeks. 07/31/23   Toney Reil, MD  Vitamin D, Ergocalciferol, (DRISDOL) 1.25 MG (50000 UNIT) CAPS capsule Take 50,000 Units by mouth once a week. 04/08/23   [provider]  ZORYVE 0.3 % FOAM Apply topically. 12/11/22   [provider]    Family History Family History  Problem Relation Age of Onset   Lupus Maternal Grandmother    Diabetes Maternal Grandmother    Heart disease Maternal Grandmother    Hypertension Maternal Grandmother    COPD Maternal Grandmother    Diabetes Maternal Grandfather    Heart disease Maternal Grandfather    Hypertension Maternal Grandfather    Cancer Neg Hx    Stroke Neg Hx     Social History Social History   Tobacco Use   Smoking status: Never   Smokeless tobacco: Never  Vaping Use   Vaping status: Never Used  Substance Use Topics   Alcohol use: No    Alcohol/week: 0.0 standard drinks of alcohol   Drug use: No     Allergies   Pineapple   Review of Systems Review of Systems  Constitutional:  Positive for fatigue. Negative for fever.  HENT:  Positive for facial swelling.   Eyes:  Positive for pain. Negative for photophobia, discharge, redness and itching.  Skin:  Positive for color change. Negative for wound.  Neurological:  Positive for headaches. Negative for weakness.     Physical Exam Triage Vital Signs ED Triage Vitals  Encounter Vitals Group     BP 10/13/23 0903 116/77     Systolic BP Percentile --      Diastolic BP Percentile --      Pulse Rate 10/13/23 0903 80     Resp 10/13/23 0903 14     Temp 10/13/23 0903 98.8 F (37.1 C)     Temp Source 10/13/23 0903 Oral     SpO2 10/13/23 0903 96 %     Weight 10/13/23 0900 123 lb 7.3 oz (56 kg)     Height 10/13/23 0900 5\' 3"  (1.6 m)     Head Circumference --      Peak Flow --      Pain Score 10/13/23 0900 6     Pain Loc --       Pain Education --      Exclude from Growth Chart --    No data found.  Updated Vital Signs BP 116/77 (BP Location: Left Arm)   Pulse 80   Temp 98.8 F (37.1 C) (Oral)   Resp 14   Ht 5\' 3"  (1.6  m)   Wt 123 lb 7.3 oz (56 kg)   LMP 10/04/2023 (Exact Date)   SpO2 96%   BMI 21.87 kg/m   Physical Exam Vitals and nursing note reviewed.  Constitutional:      General: She is not in acute distress.    Appearance: Normal appearance. She is not ill-appearing or toxic-appearing.  HENT:     Head: Normocephalic and atraumatic.     Nose: Nose normal.     Mouth/Throat:     Mouth: Mucous membranes are moist.     Pharynx: Oropharynx is clear.  Eyes:     General: No scleral icterus.       Right eye: No discharge.        Left eye: No discharge.     Conjunctiva/sclera: Conjunctivae normal.     Right eye: Right conjunctiva is not injected.     Comments: See image included in chart.  Patient has significant swelling, erythema and tenderness near the right brow.  There is an area of induration with mild fluctuance.  Swelling, erythema of upper eyelid and lower eyelid.  Tenderness about orbit.  No drainage from eye or injection of conjunctivae.  Unable to fully evaluate the actual eyeball given level of swelling.  Unable to check patient's vision given level swelling.  Cardiovascular:     Rate and Rhythm: Normal rate and regular rhythm.  Pulmonary:     Effort: Pulmonary effort is normal. No respiratory distress.  Musculoskeletal:     Cervical back: Neck supple.  Skin:    General: Skin is dry.  Neurological:     General: No focal deficit present.     Mental Status: She is alert. Mental status is at baseline.     Motor: No weakness.     Gait: Gait normal.  Psychiatric:        Mood and Affect: Mood normal.        Behavior: Behavior normal.      UC Treatments / Results  Labs (all labs ordered are listed, but only abnormal results are displayed) Labs Reviewed - No data to  display  EKG   Radiology No results found.  Procedures Procedures (including critical care time)  Medications Ordered in UC Medications - No data to display  Initial Impression / Assessment and Plan / UC Course  I have reviewed the triage vital signs and the nursing notes.  Pertinent labs & imaging results that were available during my care of the patient were reviewed by me and considered in my medical decision making (see chart for details).   23 year old female presents for 3-week history of right eyelid pain, swelling and redness.  Patient has been on 18 days of antibiotics.  Initially on 5 days of doxycycline followed by 13 days of Bactrim DS.  Reports no improvement.  Has not had any fevers.  Swelling has continued to worsen.  Vitals are normal and stable.  She is nontoxic-appearing.  See image included in chart.  She has abscess near the eyebrow and periorbital cellulitis.  Explained to patient at this time she needs further evaluation and treatment in the emergency department to evaluate for possible orbital cellulitis.  Advised that she likely needs CT imaging, IV antibiotics and possibly incision and drainage of the abscess near the brow.  Patient is understanding and agreeable and plans to go to [facility] [place] at this time.  Patient with acute illness which is steadily worsening.  Could present threat to life and bodily harm  if untreated.   Final Clinical Impressions(s) / UC Diagnoses   Final diagnoses:  Periorbital cellulitis of right eye  Abscess of face     Discharge Instructions      -You seem to have failed almost 3 weeks of oral antibiotics.  At this point you likely need to have a CT scan, possible incision and drainage and IV antibiotics.  Please go to emergency department this time.  You have been advised to follow up immediately in the emergency department for concerning signs.symptoms. If you declined EMS transport, please have a family member take you  directly to the ED at this time. Do not delay. Based on concerns about condition, if you do not follow up in th e ED, you may risk poor outcomes including worsening of condition, delayed treatment and potentially life threatening issues. If you have declined to go to the ED at this time, you should call your PCP immediately to set up a follow up appointment.  Go to ED for red flag symptoms, including; fevers you cannot reduce with Tylenol/Motrin, severe headaches, vision changes, numbness/weakness in part of the body, lethargy, confusion, intractable vomiting, severe dehydration, chest pain, breathing difficulty, severe persistent abdominal or pelvic pain, signs of severe infection (increased redness, swelling of an area), feeling faint or passing out, dizziness, etc. You should especially go to the ED for sudden acute worsening of condition if you do not elect to go at this time.    ED Prescriptions   None    PDMP not reviewed this encounter.   Shirlee Latch, PA-C 10/13/23 [phone]

## 2023-10-31 ENCOUNTER — Other Ambulatory Visit: Payer: Self-pay | Admitting: Gastroenterology

## 2023-10-31 ENCOUNTER — Other Ambulatory Visit: Payer: Self-pay

## 2023-11-04 ENCOUNTER — Ambulatory Visit (INDEPENDENT_AMBULATORY_CARE_PROVIDER_SITE_OTHER): Payer: BC Managed Care – PPO | Admitting: Gastroenterology

## 2023-11-04 ENCOUNTER — Encounter: Payer: Self-pay | Admitting: Gastroenterology

## 2023-11-04 VITALS — BP 129/82 | HR 103 | Temp 98.2°F | Ht 63.0 in | Wt 119.5 lb

## 2023-11-04 DIAGNOSIS — L409 Psoriasis, unspecified: Secondary | ICD-10-CM | POA: Diagnosis not present

## 2023-11-04 DIAGNOSIS — K5000 Crohn's disease of small intestine without complications: Secondary | ICD-10-CM

## 2023-11-04 DIAGNOSIS — K5080 Crohn's disease of both small and large intestine without complications: Secondary | ICD-10-CM

## 2023-11-04 NOTE — Progress Notes (Signed)
 Amber Repress, MD [address]  [address]  [place], [place] [address]  Main: [phone]  Fax: [phone]    Gastroenterology Consultation  Referring Provider:     Miki Kins, FNP Primary Care Physician:  Amber Kins, FNP Primary Gastroenterologist:  Amber Gibson Reason for Consultation:   Small bowel Crohn's        HPI:   Amber Gibson is a 23 y.o. 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  Patient is currently in clinical and endoscopic remission on biweekly Amber Gibson monotherapy.   Follow-up visit 05/04/2022 Amber Gibson is here for follow-up of Crohn's disease.  She is currently maintained on biweekly Amber Gibson.  She underwent colonoscopy in 12/2021 which revealed quiescent colitis, mild chronic colitis without activity in the left colon.  Rest of the colon was unremarkable including terminal ileum.  Her fecal calprotectin levels in March 2023 were normal.  The patient has been experiencing intermittent episodes of upper abdominal discomfort associated with abdominal bloating and bouts of diarrhea especially when she is stressed out at work and depending on what she eats, especially greasy foods.  The symptoms occur about every other week and generally last for 2 days, first day is bad.  She is also not able to finish her meal.  She lost about 7 pounds since last visit.  She denies any rectal bleeding.  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  Her main concern today is poorly controlled psoriasis.  About 4 to 5 months ago, she has developed scalp psoriasis as well as psoriasis in her interdigital areas in bilateral hands, behind ears.  Currently managed on Amber Gibson as well as topical steroids by her dermatologist, Amber Gibson.  Amber Gibson states that stress triggers her GI symptoms.   She feels bloated constantly, wakes up bloated as well.  She reports having 3-4 bowel movements daily.  She gained about 3 pounds since last visit.  She has been taking Zofran as needed for nausea.  Her EGD was unremarkable, gastric emptying study was normal.  She lost significant amount of hair from scalp psoriasis, has to wait a week.  Patient is accompanied by her mom today.  She is also having flareup of acne.  She has a 39-year-old daughter.  Follow-up visit 11/04/2023 Amber Gibson is here for follow-up of Crohn's disease.  She is currently on Amber Gibson for psoriasis which he is significantly helping in controlling it.  I had to switch to Amber Gibson because of secondary loss of response to Amber Gibson.  Amber Gibson was not approved by her insurance.  She has been on prolonged course of budesonide while switching from Amber Gibson to Amber Gibson, taking 3 mg 3 pills daily.  I started her recently on Colace tyramine after ruling out infection and it helped with her diarrhea.  Her main concern today is recurrent skin infections, recently had periorbital cellulitis in early February, went to the ER, underwent CT head which confirmed diagnosis and was treated with antibiotics, cellulitis has resolved.  Prior to this, she had abscess on her forehead, she had picture with me, received doxycycline.  To date, patient reports that she had 3 infections on her skin requiring antibiotics since she started combination of Amber Gibson and Amber Gibson.  She is accompanied by her mom today.  Reports good appetite, no weight  loss  Crohn's disease classification:  Age: 64 to 51 Location: Ileal  Behavior: non stricturing, non penetrating  Perianal: no  IBD diagnosis: Small bowel Crohn's  Disease course: 23 year old African-American female was initially experiencing nonbloody diarrhea and occasionally with mixed with streaks of blood associated with abdominal cramps, epigastric pain associated with nausea and vomiting started end of 2021.  She was  originally seen by Dr. Vira Gibson it [facility] gastroenterology office.  She underwent upper endoscopy and colonoscopy in 10/2020 which revealed scant, scattered aphthous ulcers in the terminal ileum.  Pathology results revealed acute ileitis only with no evidence of chronicity. Prior to her endoscopic evaluation, patient was on short course of prednisone with short course of Flagyl by her PCP as her ASCA antibodies came back positive, presuming that she has Crohn's disease.  Based on the pathology results and her symptoms, she was started on short course of prednisone by Amber Gibson due to high index of clinical suspicion for Crohn's ileitis.  Patient reports that the steroids did not provide any significant relief of her symptoms. She has been gradually losing weight but not at her prepregnancy weight. She denied any GI symptoms during her pregnancy.  She does report significant abdominal bloating as well.  Subsequently, she underwent video capsule endoscopy in 12/2020, found to have several aphthous ulcers scattered throughout the distal small bowel.  Therefore, given that she also has elevated fecal calprotectin levels to 127 and no infectious etiology found on stool studies, I have started her on Amber Gibson 9 mg daily along with Apriso 0.75 mg twice daily in 01/2021.  Patient reports that the episodes of vomiting and diarrhea have almost resolved. Patient developed flareup upon tapering budesonide.  She went back to 9 mg daily which provided complete relief, she decreased to 6 mg daily along with low-dose Apriso. Patient started Amber Gibson in mid October 2022, standard induction dose followed by maintenance every 2 weeks.  After 1 month of starting Amber Gibson, patient noticed to have mild injection site reaction which was swelling and redness.  She also noticed to have tender bumps in her underarms or neck which she felt like boils with greenish drainage from the bump.  She contacted me about these lesions via  my chart, I gave her 4 weeks of doxycycline 100 mg twice daily with resolution of lesions.  Her fecal calprotectin levels returned normal in 07/2021.  Amber Gibson trough level 14, undetectable antibodies as of 08/07/2021.  Unfortunately, patient developed anti-TNF induced psoriasis, therefore Amber Gibson has been discontinued and switched her to Amber Gibson.  Her psoriasis is currently being managed with Amber Gibson  Extra intestinal manifestations: Hidradenitis s/p treatment with doxycycline  IBD surgical history: None  Imaging:  MRE 07/13/2022 IMPRESSION: 1. The distal small bowel is normal in caliber and configuration, with some mild mucosal hyperenhancement of the small bowel in the right lower quadrant, suggesting mild inflammation and generally in keeping with reported history of Crohn's disease. There is however no bowel wall thickening or evidence of complicating stricture, obstruction, fistula, or abscess. 2. Some thickened appearing, although underdistended loops of proximal small bowel in the left upper quadrant, however there is no associated mucosal hyperenhancement on postcontrast imaging. No convincing evidence of proximal small bowel inflammation.  CTE none   SBFT none  Procedures:  Upper endoscopy 06/08/2022 DIAGNOSIS:  A. DUODENUM; COLD BIOPSY:  - UNREMARKABLE DUODENAL MUCOSA.  - NEGATIVE FOR GRANULOMAS, DYSPLASIA, AND MALIGNANCY.   B.  STOMACH; COLD BIOPSY:  - UNREMARKABLE GASTRIC MUCOSA.  - NEGATIVE  FOR H. PYLORI, INTESTINAL METAPLASIA, GRANULOMAS, DYSPLASIA,  AND MALIGNANCY.   EGD and colonoscopy 11/24/2020 EGD normal, gastric biopsies were performed Colonoscopy revealed normal colon, aphthous ulcers in the terminal ileum, biopsies performed Video capsule endoscopy 12/21/2020 Capsule study was incomplete as capsule was retained in stomach for 3 hours 44 minutes.  Images were not captured beyond 8-hour time period.  Within the limited study, there were several aphthous  ulcers scattered throughout the distal small bowel.  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  There is no history of NSAID use  Colonoscopy 01/15/2022 - The examined portion of the ileum was normal. Biopsied. - The entire examined colon is normal. Biopsied. Clip (MR conditional) was placed. - The distal rectum and anal verge are normal on retroflexion view. - Non-bleeding external hemorrhoids.  DIAGNOSIS:  A. TERMINAL ILEUM; COLD BIOPSY:  - ENTERIC MUCOSA WITH NORMAL VILLOUS ARCHITECTURE AND REACTIVE LYMPHOID  HYPERPLASIA.  - NEGATIVE FOR ACTIVE MUCOSAL ENTERITIS.  - NEGATIVE FOR GRANULOMA, DYSPLASIA, AND MALIGNANCY.   B. COLON, RIGHT; COLD BIOPSY:  - BENIGN COLONIC MUCOSA WITH NO SIGNIFICANT HISTOPATHOLOGIC CHANGE.  - NEGATIVE FOR ACTIVE MUCOSAL COLITIS AND ARCHITECTURAL CHANGES OF  CHRONICITY.  - NEGATIVE FOR GRANULOMA, DYSPLASIA, AND MALIGNANCY.   C. COLON, LEFT; COLD BIOPSY:  - MILD CHRONIC COLITIS WITHOUT ACTIVITY.  - NEGATIVE FOR GRANULOMA, DYSPLASIA, AND MALIGNANCY.   IBD medications:  Steroids: Did not respond to prednisone, responded well to budesonide x 2 courses in the past 5-ASA: Tried Apriso prior to initiation of Amber Gibson with no response Immunomodulators: AZA, methotrexate TPMT status normal Biologics:  Anti TNFs: Amber Gibson monotherapy initiated in mid October 2022 Undetectable antibodies, therapeutic trough levels in 07/2021 Anti Integrins: Ustekinumab: Tofactinib: Clinical trial:  She does not smoke tobacco, denies alcohol use, denies marijuana use or IV drug abuse  NSAIDs: None  Antiplts/Anticoagulants/Anti thrombotics: None    Past Medical History:  Diagnosis Date   Crohn disease (HCC)    Menometrorrhagia    Orthostatic proteinuria    Vitamin D deficiency     Past Surgical History:  Procedure Laterality Date   COLONOSCOPY WITH PROPOFOL N/A 01/15/2022   Procedure: COLONOSCOPY WITH PROPOFOL;  Surgeon: Toney Reil, MD;   Location: [facility] ENDOSCOPY;  Service: Gastroenterology;  Laterality: N/A;   ESOPHAGOGASTRODUODENOSCOPY (EGD) WITH PROPOFOL N/A 06/08/2022   Procedure: ESOPHAGOGASTRODUODENOSCOPY (EGD) WITH PROPOFOL;  Surgeon: Toney Reil, MD;  Location: [facility] ENDOSCOPY;  Service: Gastroenterology;  Laterality: N/A;   GIVENS CAPSULE STUDY N/A 12/21/2020   Procedure: GIVENS CAPSULE STUDY;  Surgeon: Toney Reil, MD;  Location: [facility] ENDOSCOPY;  Service: Gastroenterology;  Laterality: N/A;    Current Outpatient Medications:    budesonide (Amber Gibson EC) 3 MG 24 hr capsule, TAKE 3 CAPSULES (9 MG TOTAL) BY MOUTH DAILY., Disp: 90 capsule, Rfl: 0   cholestyramine (QUESTRAN) 4 g packet, TAKE 1 PACKET BY MOUTH DAILY., Disp: 30 packet, Rfl: 0   Clobetasol Propionate 0.05 % shampoo, Apply topically., Disp: , Rfl:    Deucravacitinib (Amber Gibson) 6 MG TABS, Take by mouth., Disp: , Rfl:    mometasone (ELOCON) 0.1 % cream, Apply 1 Application topically daily., Disp: , Rfl:    ondansetron (ZOFRAN) 4 MG tablet, Take 4 mg by mouth every 8 (eight) hours as needed for nausea or vomiting., Disp: , Rfl:    promethazine (PHENERGAN) 25 MG tablet, Take 1 tablet (25 mg total) by mouth every 6 (six) hours as needed for nausea or vomiting., Disp: 30 tablet, Rfl: 1  ustekinumab (Amber Gibson) 90 MG/ML SOSY injection, Inject 1 mL (90 mg total) into the skin every 8 (eight) weeks., Disp: 1 mL, Rfl: 5   ondansetron (ZOFRAN) 4 MG tablet, Take 1 tablet (4 mg total) by mouth every 8 (eight) hours as needed for nausea or vomiting. (Patient not taking: Reported on 11/04/2023), Disp: 30 tablet, Rfl: 2   Family History  Problem Relation Age of Onset   Lupus Maternal Grandmother    Diabetes Maternal Grandmother    Heart disease Maternal Grandmother    Hypertension Maternal Grandmother    COPD Maternal Grandmother    Diabetes Maternal Grandfather    Heart disease Maternal Grandfather    Hypertension Maternal Grandfather    Cancer Neg Hx     Stroke Neg Hx      Social History   Tobacco Use   Smoking status: Never   Smokeless tobacco: Never  Vaping Use   Vaping status: Never Used  Substance Use Topics   Alcohol use: No    Alcohol/week: 0.0 standard drinks of alcohol   Drug use: No    Allergies as of 11/04/2023 - Review Complete 11/04/2023  Allergen Reaction Noted   Pineapple  01/21/2020    Review of Systems:    All systems reviewed and negative except where noted in HPI.   Physical Exam:  BP 129/82 (BP Location: Left Arm, Patient Position: Sitting, Cuff Size: Normal)   Pulse (!) 103   Temp 98.2 F (36.8 C) (Oral)   Ht 5\' 3"  (1.6 m)   Wt 119 lb 8 oz (54.2 kg)   LMP 10/04/2023 (Exact Date)   BMI 21.17 kg/m  Patient's last menstrual period was 10/04/2023 (exact date).  General:   Alert,  Well-developed, well-nourished, pleasant and cooperative in NAD Head:  Normocephalic and atraumatic. Eyes:  Sclera clear, no icterus.   Conjunctiva pink. Ears:  Normal auditory acuity. Nose:  No deformity, discharge, or lesions. Mouth:  No deformity or lesions,oropharynx pink & moist. Neck:  Supple; no masses or thyromegaly. Lungs:  Respirations even and unlabored.  Clear throughout to auscultation.   No wheezes, crackles, or rhonchi. No acute distress. Heart:  Regular rate and rhythm; no murmurs, clicks, rubs, or gallops. Abdomen:  Normal bowel sounds. Soft, non-tender and moderately distended, tympanic to percussion without masses, hepatosplenomegaly or hernias noted.  No guarding or rebound tenderness.   Rectal: Not performed Msk:  Symmetrical without gross deformities. Good, equal movement & strength bilaterally. Pulses:  Normal pulses noted. Extremities:  No clubbing or edema.  No cyanosis. Neurologic:  Alert and oriented x3;  grossly normal neurologically. Skin: Scaly rash in interdigital clefts in bilateral palms, behind ears as well as scalp, no jaundice. Psych:  Alert and cooperative. Normal mood and  affect.  Imaging Studies: None  Assessment and Plan:   Amber Gibson is a 23 y.o. pleasant African-American female with small bowel Crohn's diagnosed in 10/2020, not stricturing, nonpenetrating phenotype, history of hidradenitis suppurative, diagnosed with psoriasis in 12/2022   Small bowel Crohn's, diagnosed in 10/2020, nonstricturing, nonpenetrating phenotype Previously on budesonide and Apriso  Initiated Amber Gibson monotherapy in 05/2021, currently in clinical and histologic remission based on the colonoscopy from 12/2021 Undetectable Amber Gibson antibodies, therapeutic drug levels as of 12/22 Elevated fecal calprotectin levels Discontinued Amber Gibson due to concern for anti-TNF induced psoriasis in 04/2023 Patient's dermatologist recommended Skyrizi for treatment of psoriasis.  I agree with Amber Gibson for treatment of Crohn's along with psoriasis due to concern for unacceptable risk for anti-TNF induced psoriasis.  Unfortunately, her insurance did not approve Skyrizi Therefore, started on Amber Gibson for Crohn's disease and Amber Gibson for psoriasis Continue cholestyramine Recheck labs today  Recurrent skin infections I am concerned about immunosuppression predisposing her to frequent skin infections needing antibiotics on combination of Amber Gibson and Amber Gibson  Discontinue budesonide I will not be able to stop Amber Gibson for her Crohn's disease which is also the treatment for psoriasis Advised patient to discuss with Amber Gibson regarding the possibility of alternative management for her psoriasis given that it is currently under control to minimize the risk of immunosuppression on dual agents  Hidradenitis S/p treatment with doxycycline  IBD Health Maintenance  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 Anemia: None, no evidence of iron deficiency or B12 deficiency 3.Immunizations: Hep A and B vaccinated, Influenza annual influenza vaccine, up-to-date, prevnar received, pneumovax 9/22, Varicella  immunized, Zoster first dose of Shingrix in end of September 2022, received second Shingrix vaccine 4.Cancer screening I) Colon cancer/dysplasia surveillance: None II) Cervical cancer: Pap smear up-to-date III) Skin cancer - counseled about annual skin exam by dermatology and skin protection in summer using sun screen SPF > 50, clothing 5.Bone health Vitamin D status: Has mild to moderate vitamin D deficiency, s/p replacement Recheck levels today Bone density testing: Not done 5. Labs: Every 3 months 6. Smoking: None 7. NSAIDs and Antibiotics use: None   Follow up in 6 months or sooner based on the above work-up   Amber Repress, MD

## 2023-11-05 ENCOUNTER — Telehealth: Payer: Self-pay

## 2023-11-05 LAB — COMPREHENSIVE METABOLIC PANEL
ALT: 22 IU/L (ref 0–32)
AST: 24 IU/L (ref 0–40)
Albumin: 4.4 g/dL (ref 4.0–5.0)
Alkaline Phosphatase: 66 IU/L (ref 44–121)
BUN/Creatinine Ratio: 21 (ref 9–23)
BUN: 14 mg/dL (ref 6–20)
Bilirubin Total: <0.2 mg/dL (ref 0.0–1.2)
CO2: 22 mmol/L (ref 20–29)
Calcium: 9.3 mg/dL (ref 8.7–10.2)
Chloride: 103 mmol/L (ref 96–106)
Creatinine, Ser: 0.67 mg/dL (ref 0.57–1.00)
Globulin, Total: 3 g/dL (ref 1.5–4.5)
Glucose: 75 mg/dL (ref 70–99)
Potassium: 4.6 mmol/L (ref 3.5–5.2)
Sodium: 138 mmol/L (ref 134–144)
Total Protein: 7.4 g/dL (ref 6.0–8.5)
eGFR: 125 mL/min/{1.73_m2} (ref >59)

## 2023-11-05 LAB — CBC WITH DIFFERENTIAL/PLATELET
Basophils Absolute: 0 10*3/uL (ref 0.0–0.2)
Basos: 1 %
EOS (ABSOLUTE): 0.2 10*3/uL (ref 0.0–0.4)
Eos: 3 %
Hematocrit: 38.2 % (ref 34.0–46.6)
Hemoglobin: 13 g/dL (ref 11.1–15.9)
Immature Grans (Abs): 0 10*3/uL (ref 0.0–0.1)
Immature Granulocytes: 0 %
Lymphocytes Absolute: 2.2 10*3/uL (ref 0.7–3.1)
Lymphs: 45 %
MCH: 28.3 pg (ref 26.6–33.0)
MCHC: 34 g/dL (ref 31.5–35.7)
MCV: 83 fL (ref 79–97)
Monocytes Absolute: 0.7 10*3/uL (ref 0.1–0.9)
Monocytes: 15 %
Neutrophils Absolute: 1.8 10*3/uL (ref 1.4–7.0)
Neutrophils: 36 %
Platelets: 305 10*3/uL (ref 150–450)
RBC: 4.59 x10E6/uL (ref 3.77–5.28)
RDW: 12.8 % (ref 11.7–15.4)
WBC: 4.9 10*3/uL (ref 3.4–10.8)

## 2023-11-05 NOTE — Telephone Encounter (Signed)
 Sent mychart message with results and put a reminder for 3 months

## 2023-11-05 NOTE — Telephone Encounter (Signed)
-----   Message from [facility] sent at 11/05/2023 10:19 AM EDT ----- Normal labs, recheck CBC and LFTs in 3 months  RV

## 2023-11-11 ENCOUNTER — Encounter: Payer: Self-pay | Admitting: Gastroenterology

## 2023-11-11 DIAGNOSIS — Z3491 Encounter for supervision of normal pregnancy, unspecified, first trimester: Secondary | ICD-10-CM

## 2023-11-11 DIAGNOSIS — K5080 Crohn's disease of both small and large intestine without complications: Secondary | ICD-10-CM

## 2023-11-12 ENCOUNTER — Telehealth: Payer: Self-pay

## 2023-11-12 NOTE — Telephone Encounter (Signed)
 Per Dr. Allegra Lai:  Please send urgent referral to [facility] Ob/gyn  High risk pregnancy - Pregnant and h/o crohn's disease on biologic(stelara)   RV   Placed referral as urgent to [facility]. Faxed referral and referral form to [phone]. Faxed with referral demographics, Insurances, Medication lists, and last office visit.  Got confirmation fax went through. Called patient and gave patient the number of the office to schedule which is [phone]. Informed patient that she can call tomorrow to schedule

## 2023-11-12 NOTE — Telephone Encounter (Signed)
 Placed referral and faxed referral to [facility]

## 2023-11-13 NOTE — Telephone Encounter (Signed)
 Patient is schedule for 12/06/2023 at 4:00pm

## 2023-11-18 ENCOUNTER — Ambulatory Visit: Payer: BC Managed Care – PPO | Admitting: Gastroenterology

## 2023-11-20 ENCOUNTER — Other Ambulatory Visit: Payer: Self-pay

## 2023-11-20 ENCOUNTER — Emergency Department (HOSPITAL_COMMUNITY)
Admission: EM | Admit: 2023-11-20 | Discharge: 2023-11-20 | Disposition: A | Discharge status: Home / Self Care | Attending: Emergency Medicine | Admitting: Emergency Medicine

## 2023-11-20 ENCOUNTER — Encounter (HOSPITAL_COMMUNITY): Payer: Self-pay

## 2023-11-20 DIAGNOSIS — Z3A01 Less than 8 weeks gestation of pregnancy: Secondary | ICD-10-CM | POA: Diagnosis not present

## 2023-11-20 DIAGNOSIS — Z3490 Encounter for supervision of normal pregnancy, unspecified, unspecified trimester: Secondary | ICD-10-CM

## 2023-11-20 DIAGNOSIS — O219 Vomiting of pregnancy, unspecified: Secondary | ICD-10-CM | POA: Insufficient documentation

## 2023-11-20 DIAGNOSIS — R112 Nausea with vomiting, unspecified: Secondary | ICD-10-CM

## 2023-11-20 LAB — URINALYSIS, ROUTINE W REFLEX MICROSCOPIC
Bacteria, UA: NONE SEEN
Bilirubin Urine: NEGATIVE
Glucose, UA: NEGATIVE mg/dL
Hgb urine dipstick: NEGATIVE
Ketones, ur: 80 mg/dL — AB
Nitrite: NEGATIVE
Protein, ur: 100 mg/dL — AB
Specific Gravity, Urine: 1.03 (ref 1.005–1.030)
pH: 5 (ref 5.0–8.0)

## 2023-11-20 LAB — CBC
HCT: 38.3 % (ref 36.0–46.0)
Hemoglobin: 12.7 g/dL (ref 12.0–15.0)
MCH: 28.2 pg (ref 26.0–34.0)
MCHC: 33.2 g/dL (ref 30.0–36.0)
MCV: 84.9 fL (ref 80.0–100.0)
Platelets: 341 10*3/uL (ref 150–400)
RBC: 4.51 MIL/uL (ref 3.87–5.11)
RDW: 13.8 % (ref 11.5–15.5)
WBC: 9.4 10*3/uL (ref 4.0–10.5)
nRBC: 0 % (ref 0.0–0.2)

## 2023-11-20 LAB — COMPREHENSIVE METABOLIC PANEL
ALT: 27 U/L (ref 0–44)
AST: 25 U/L (ref 15–41)
Albumin: 4.4 g/dL (ref 3.5–5.0)
Alkaline Phosphatase: 42 U/L (ref 38–126)
Anion gap: 10 (ref 5–15)
BUN: 17 mg/dL (ref 6–20)
CO2: 21 mmol/L — ABNORMAL LOW (ref 22–32)
Calcium: 9.3 mg/dL (ref 8.9–10.3)
Chloride: 101 mmol/L (ref 98–111)
Creatinine, Ser: 0.62 mg/dL (ref 0.44–1.00)
GFR, Estimated: >60 mL/min (ref >60)
Glucose, Bld: 86 mg/dL (ref 70–99)
Potassium: 3.4 mmol/L — ABNORMAL LOW (ref 3.5–5.1)
Sodium: 132 mmol/L — ABNORMAL LOW (ref 135–145)
Total Bilirubin: 0.7 mg/dL (ref 0.0–1.2)
Total Protein: 8.3 g/dL — ABNORMAL HIGH (ref 6.5–8.1)

## 2023-11-20 LAB — HCG, QUANTITATIVE, PREGNANCY: hCG, Beta Chain, Quant, S: 62347 m[IU]/mL — ABNORMAL HIGH (ref <5)

## 2023-11-20 LAB — LIPASE, BLOOD: Lipase: 22 U/L (ref 11–51)

## 2023-11-20 MED ORDER — METOCLOPRAMIDE HCL 5 MG/ML IJ SOLN: 10.0000 mg | Freq: Once | INTRAMUSCULAR | Status: DC

## 2023-11-20 MED ORDER — LACTATED RINGERS IV BOLUS: 1000.0000 mL | Freq: Once | INTRAVENOUS | Status: DC

## 2023-11-20 MED ORDER — SODIUM CHLORIDE 0.9 % IV BOLUS
1000.0000 mL | Freq: Once | INTRAVENOUS | Status: AC
  Administered 2023-11-20: 1000 mL via INTRAVENOUS

## 2023-11-20 MED ORDER — DOXYLAMINE-PYRIDOXINE 10-10 MG PO TBEC: DELAYED_RELEASE_TABLET | ORAL | 0 refills | Status: DC

## 2023-11-20 MED ORDER — PROCHLORPERAZINE EDISYLATE 10 MG/2ML IJ SOLN
5.0000 mg | Freq: Once | INTRAMUSCULAR | Status: AC
  Administered 2023-11-20: 5 mg via INTRAVENOUS
  Filled 2023-11-20: qty 2

## 2023-11-20 NOTE — ED Provider Triage Note (Signed)
 Emergency Medicine Provider Triage Evaluation Note  Amber Gibson , a 23 y.o. female  G2P1 was evaluated in triage.  Pt complains of vomiting with pregnancy. She reports that she has been vomiting for the past 48 hours. She reports that she has tried Zofran multiple times at home without success. Tried phenergan last night without success as well. Has a h/o crohn's. Denies any abdominal pain, changes in bowel movements, fever, vaginal bleeding, abnormal vaginal discharge, dysuria, or hematuria. She reports that she vomited with her last child as well. She reports that she can feel her heart beating, but no chest pain or SOB. LMP 10/04/23. She reports that she has had multiple positive pregnancy tests at home. She called her OBGYN with her symptoms, who informed her that she may be dehydrated and to present to the ER.   Review of Systems  Positive:  Negative:   Physical Exam  BP (!) 141/84   Pulse (!) 104   Temp 98 F (36.7 C)   Resp 16   Ht 5\' 3"  (1.6 m)   Wt 52.2 kg   SpO2 100%   BMI 20.37 kg/m  Gen:   Awake, no distress   Resp:  Normal effort  MSK:   Moves extremities without difficulty  Other: Dry mucous membranes. No abdominal tenderness to palpation.   Medical Decision Making  Medically screening exam initiated at 3:10 PM.  Appropriate orders placed.  Jazaria Luanna Cole Boedecker was informed that the remainder of the evaluation will be completed by another provider, this initial triage assessment does not replace that evaluation, and the importance of remaining in the ED until their evaluation is complete.  Will order labs and fluids.    Achille Rich, [place] 11/20/23 [id number]

## 2023-11-20 NOTE — ED Triage Notes (Signed)
 3 positive pregnancy tests at home. G2, P1. LMP 2/7. Pt states she has been vomiting the last 2 days and can't hold anything down. OBGYN told pt to come to ER

## 2023-11-20 NOTE — ED Provider Notes (Signed)
 [place] EMERGENCY DEPARTMENT AT [facility] Provider Note   CSN: [id number] Arrival date & time: 11/20/23  1444     History  Chief Complaint  Patient presents with   Possible Pregnancy   Emesis    Amber Gibson is a 23 y.o. female.  Patient here with nausea and vomiting.  She is maybe about [redacted] weeks pregnant.  Last 48 hours have been fairly difficult but she denies any abdominal pain vaginal bleeding.  Denies any chest pain shortness of breath weakness numbness tingling.  She has a history of Crohn's.  She has been taking Zofran at home with some help but OB doctor recommended that she come for some IV fluids and evaluation.  She denies any fever or chills.  The history is provided by the patient.       Home Medications Prior to Admission medications   Medication Sig Start Date End Date Taking? Authorizing Provider  Doxylamine-Pyridoxine 10-10 MG TBEC Take 2 (two) tablets by mouth at bedtime on Day 1 and 2; if symptoms persist, take 1 (one) tablet in morning and 2 (two) tablets at bedtime on day 3; If symptoms persist, may increase to MAX 4 (four) tablets per day, administered as 1(one) tablet in morning, 1 (one) tablet in mid-afternoon and 2 (two) tablets at bedtime. 11/20/23  Yes Miro Balderson, DO  budesonide (ENTOCORT EC) 3 MG 24 hr capsule TAKE 3 CAPSULES (9 MG TOTAL) BY MOUTH DAILY. 10/31/23   Toney Reil, MD  cholestyramine (QUESTRAN) 4 g packet TAKE 1 PACKET BY MOUTH DAILY. 10/31/23   Toney Reil, MD  Clobetasol Propionate 0.05 % shampoo Apply topically. 01/23/23   [provider]  Deucravacitinib (SOTYKTU) 6 MG TABS Take by mouth.    [provider]  mometasone (ELOCON) 0.1 % cream Apply 1 Application topically daily. 03/31/23   [provider]  ondansetron (ZOFRAN) 4 MG tablet Take 4 mg by mouth every 8 (eight) hours as needed for nausea or vomiting.    [provider]  promethazine (PHENERGAN) 25 MG tablet  Take 1 tablet (25 mg total) by mouth every 6 (six) hours as needed for nausea or vomiting. 08/02/22   Toney Reil, MD  ustekinumab (STELARA) 90 MG/ML SOSY injection Inject 1 mL (90 mg total) into the skin every 8 (eight) weeks. 07/31/23   Toney Reil, MD      Allergies    Pineapple    Review of Systems   Review of Systems  Physical Exam Updated Vital Signs BP 125/83   Pulse (!) 106   Temp 98.1 F (36.7 C) (Oral)   Resp 20   Ht 5\' 3"  (1.6 m)   Wt 52.2 kg   SpO2 100%   BMI 20.37 kg/m  Physical Exam Vitals and nursing note reviewed.  Constitutional:      General: She is not in acute distress.    Appearance: She is well-developed. She is not ill-appearing.  HENT:     Head: Normocephalic and atraumatic.     Mouth/Throat:     Mouth: Mucous membranes are moist.  Eyes:     Extraocular Movements: Extraocular movements intact.     Conjunctiva/sclera: Conjunctivae normal.     Pupils: Pupils are equal, round, and reactive to light.  Cardiovascular:     Rate and Rhythm: Normal rate and regular rhythm.     Pulses: Normal pulses.     Heart sounds: Normal heart sounds. No murmur heard. Pulmonary:  Effort: Pulmonary effort is normal. No respiratory distress.     Breath sounds: Normal breath sounds.  Abdominal:     Palpations: Abdomen is soft.     Tenderness: There is no abdominal tenderness.  Musculoskeletal:        General: No swelling.     Cervical back: Normal range of motion and neck supple.  Skin:    General: Skin is warm and dry.     Capillary Refill: Capillary refill takes less than 2 seconds.  Neurological:     General: No focal deficit present.     Mental Status: She is alert and oriented to person, place, and time.     Cranial Nerves: No cranial nerve deficit.     Sensory: No sensory deficit.     Motor: No weakness.     Coordination: Coordination normal.  Psychiatric:        Mood and Affect: Mood normal.     ED Results / Procedures / Treatments    Labs (all labs ordered are listed, but only abnormal results are displayed) Labs Reviewed  COMPREHENSIVE METABOLIC PANEL - Abnormal; Notable for the following components:      Result Value   Sodium 132 (*)    Potassium 3.4 (*)    CO2 21 (*)    Total Protein 8.3 (*)    All other components within normal limits  URINALYSIS, ROUTINE W REFLEX MICROSCOPIC - Abnormal; Notable for the following components:   APPearance HAZY (*)    Ketones, ur 80 (*)    Protein, ur 100 (*)    Leukocytes,Ua TRACE (*)    All other components within normal limits  HCG, QUANTITATIVE, PREGNANCY - Abnormal; Notable for the following components:   hCG, Beta Chain, Quant, S 47,829 (*)    All other components within normal limits  LIPASE, BLOOD  CBC    EKG None  Radiology No results found.  Procedures Procedures    Medications Ordered in ED Medications  sodium chloride 0.9 % bolus 1,000 mL (1,000 mLs Intravenous New Bag/Given 11/20/23 1813)  prochlorperazine (COMPAZINE) injection 5 mg (5 mg Intravenous Given 11/20/23 1813)    ED Course/ Medical Decision Making/ A&P                                 Medical Decision Making Amount and/or Complexity of Data Reviewed Labs: ordered.  Risk Prescription drug management.   Amber Gibson is been here with nausea and vomiting.  She is [redacted] weeks pregnant.  History of Crohn's.  She has no abdominal pain chest pain shortness of breath weakness numbness tingling.  She does have some dry mucous membranes on exam.  She not actively vomiting now.  She has no vaginal bleeding.  Differential diagnosis likely hyperemesis from pregnancy have no concern for intra-abdominal process including no concern for ectopic pregnancy or Crohn's flare.  Not having any diarrhea.  No hematochezia.  No pain with urination.  She has been trying Zofran and Phenergan without much relief at home.  Will give her a dose of Compazine normal saline bolus check basic labs.  Per my  review interpretation labs no significant anemia electrolyte abnormality kidney injury or leukocytosis.  Urinalysis negative for infection.  Pregnancy test positive with a quant of around 60,000.  Appears that this is likely what I would expect around when her last menstrual period was.  Will reevaluate after meds.  I have prescribed  Diclegis for her to use as well in addition to Zofran for breakthrough nausea.  Per reevaluation she is feeling much better.  Will have her trial Diclegis at home the Zofran intermittently for breakthrough nausea.  Continue aggressive hydration at home.  EKG shows sinus rhythm.  Heart rate in the 100.  She is not having any other symptoms.  No chest pain shortness of breath and no concern for other emergent process at this time.  I do think that this is likely hyperemesis from her pregnancy.  She will continue hydration at home and follow-up with OB/GYN for further nausea control.  Understands return precautions.  Is not having any vaginal bleeding or severe abdominal pain understands to return if these things occur or other concerning symptoms.  This chart was dictated using voice recognition software.  Despite best efforts to proofread,  errors can occur which can change the documentation meaning.         Final Clinical Impression(s) / ED Diagnoses Final diagnoses:  Nausea and vomiting, unspecified vomiting type  Pregnancy, unspecified gestational age    Rx / DC Orders ED Discharge Orders          Ordered    Doxylamine-Pyridoxine 10-10 MG TBEC        11/20/23 1802              Virgina Norfolk, DO 11/20/23 1915

## 2023-12-04 ENCOUNTER — Other Ambulatory Visit: Payer: Self-pay | Admitting: Gastroenterology

## 2023-12-05 ENCOUNTER — Telehealth: Payer: Self-pay

## 2023-12-05 DIAGNOSIS — K5080 Crohn's disease of both small and large intestine without complications: Secondary | ICD-10-CM

## 2023-12-05 LAB — HEPATITIS C ANTIBODY: HCV Ab: NEGATIVE

## 2023-12-05 LAB — OB RESULTS CONSOLE RUBELLA ANTIBODY, IGM: Rubella: IMMUNE

## 2023-12-05 LAB — OB RESULTS CONSOLE HIV ANTIBODY (ROUTINE TESTING): HIV: NONREACTIVE

## 2023-12-05 LAB — OB RESULTS CONSOLE HEPATITIS B SURFACE ANTIGEN: Hepatitis B Surface Ag: NEGATIVE

## 2023-12-05 LAB — OB RESULTS CONSOLE RPR: RPR: NONREACTIVE

## 2023-12-05 NOTE — Telephone Encounter (Signed)
-----   Message from [facility] [facility] sent at 09/06/2023 11:21 AM EST ----- Recheck CBC and LFTs in 3 months

## 2023-12-05 NOTE — Telephone Encounter (Signed)
 Called patient and patient states she will get this done as soon as she can

## 2023-12-05 NOTE — Telephone Encounter (Signed)
Order lab work  ?

## 2023-12-12 ENCOUNTER — Other Ambulatory Visit: Payer: Self-pay | Admitting: Obstetrics and Gynecology

## 2023-12-12 DIAGNOSIS — K5090 Crohn's disease, unspecified, without complications: Secondary | ICD-10-CM

## 2023-12-30 DIAGNOSIS — O09892 Supervision of other high risk pregnancies, second trimester: Secondary | ICD-10-CM | POA: Insufficient documentation

## 2024-01-06 ENCOUNTER — Ambulatory Visit

## 2024-01-06 ENCOUNTER — Ambulatory Visit (HOSPITAL_BASED_OUTPATIENT_CLINIC_OR_DEPARTMENT_OTHER): Admitting: Obstetrics

## 2024-01-06 ENCOUNTER — Ambulatory Visit: Attending: Obstetrics and Gynecology

## 2024-01-06 ENCOUNTER — Other Ambulatory Visit: Payer: Self-pay | Admitting: *Deleted

## 2024-01-06 VITALS — BP 119/77 | HR 112

## 2024-01-06 DIAGNOSIS — Z3A13 13 weeks gestation of pregnancy: Secondary | ICD-10-CM | POA: Diagnosis not present

## 2024-01-06 DIAGNOSIS — D563 Thalassemia minor: Secondary | ICD-10-CM

## 2024-01-06 DIAGNOSIS — O09892 Supervision of other high risk pregnancies, second trimester: Secondary | ICD-10-CM | POA: Insufficient documentation

## 2024-01-06 DIAGNOSIS — K5000 Crohn's disease of small intestine without complications: Secondary | ICD-10-CM | POA: Insufficient documentation

## 2024-01-06 DIAGNOSIS — K5090 Crohn's disease, unspecified, without complications: Secondary | ICD-10-CM | POA: Diagnosis not present

## 2024-01-06 DIAGNOSIS — O99011 Anemia complicating pregnancy, first trimester: Secondary | ICD-10-CM

## 2024-01-06 DIAGNOSIS — O99611 Diseases of the digestive system complicating pregnancy, first trimester: Secondary | ICD-10-CM | POA: Diagnosis not present

## 2024-01-06 DIAGNOSIS — D569 Thalassemia, unspecified: Secondary | ICD-10-CM | POA: Insufficient documentation

## 2024-01-06 DIAGNOSIS — Z3482 Encounter for supervision of other normal pregnancy, second trimester: Secondary | ICD-10-CM | POA: Diagnosis present

## 2024-01-06 DIAGNOSIS — O09211 Supervision of pregnancy with history of pre-term labor, first trimester: Secondary | ICD-10-CM

## 2024-01-06 NOTE — Progress Notes (Signed)
 MFM Consult Note  Amber Gibson is currently at 13 weeks and 3 days.  She was seen due to a history of maternal Crohn's disease which was diagnosed in 2022.  The patient reports that her GI doctor recently discontinued Stelara  and budesonide  treatments due to her pregnancy.  She sees Dr. Baldomero Bone at [facility].  She has a history of a prior late preterm birth at 35 weeks and 6 days.  The cause of the preterm birth remains undetermined.  On today's ultrasound exam, a viable singleton intrauterine gestation with a crown-rump length consistent with an [facility] of July 10, 2024, making her 13 weeks and 3 days pregnant was visualized.    An early assessment of the fetal anatomy did not reveal any obvious fetal anomalies.  The following were discussed during today's consultation:  Crohn's disease in pregnancy  The patient was reassured that Crohn's disease can be managed safely in pregnancy, with the main goal being to keep the disease in remission.  Active disease during pregnancy can increase the risk of complications such as preterm birth and IUGR/low birthweight.  She was advised that oral steroids such as prednisone  may be taken during pregnancy to treat an acute flare.    The anti-tumor necrosis factor biologic medications such as Remicade, Humira , and Cimzia have all been safely used during pregnancy in women with Crohn's disease to keep the disease in remission.  There is limited information regarding the use of Stelara  in pregnancy.  However, it is presumed to be safe.  The patient was advised to contact her GI doctor and be restarted on one of these medications to keep her Crohn's disease in remission.    Her GI doctor can contact me should she have any questions regarding a specific medication to be used during pregnancy.  As maternal Crohn's disease has been associated with IUGR, we will continue to follow her with growth ultrasounds throughout her pregnancy.  Screening for  fetal aneuploidy  As the patient has not had a cell free DNA test drawn yet in her current pregnancy, she had the Panorama cell free DNA test drawn following today's ultrasound exam.  Our genetic counselor will notify the patient's regarding the results of this test.  Due to maternal Crohn's disease, a detailed fetal anatomy scan has been scheduled in our office at around 19 weeks.   The patient stated that all of her questions were answered today.  A total of 45 minutes was spent counseling and coordinating the care for this patient.  Greater than 50% of the time was spent in direct face-to-face contact.

## 2024-01-07 ENCOUNTER — Encounter: Payer: Self-pay | Admitting: Gastroenterology

## 2024-01-11 LAB — PANORAMA PRENATAL TEST FULL PANEL:PANORAMA TEST PLUS 5 ADDITIONAL MICRODELETIONS: FETAL FRACTION: 8.2

## 2024-01-13 ENCOUNTER — Ambulatory Visit: Payer: Self-pay

## 2024-02-17 ENCOUNTER — Other Ambulatory Visit: Payer: Self-pay | Admitting: *Deleted

## 2024-02-17 ENCOUNTER — Ambulatory Visit

## 2024-02-17 ENCOUNTER — Ambulatory Visit (HOSPITAL_BASED_OUTPATIENT_CLINIC_OR_DEPARTMENT_OTHER): Attending: Obstetrics and Gynecology | Admitting: Maternal & Fetal Medicine

## 2024-02-17 VITALS — BP 112/63

## 2024-02-17 DIAGNOSIS — O99612 Diseases of the digestive system complicating pregnancy, second trimester: Secondary | ICD-10-CM

## 2024-02-17 DIAGNOSIS — O99012 Anemia complicating pregnancy, second trimester: Secondary | ICD-10-CM | POA: Diagnosis not present

## 2024-02-17 DIAGNOSIS — K5000 Crohn's disease of small intestine without complications: Secondary | ICD-10-CM

## 2024-02-17 DIAGNOSIS — O2692 Pregnancy related conditions, unspecified, second trimester: Secondary | ICD-10-CM

## 2024-02-17 DIAGNOSIS — D563 Thalassemia minor: Secondary | ICD-10-CM | POA: Insufficient documentation

## 2024-02-17 DIAGNOSIS — O09212 Supervision of pregnancy with history of pre-term labor, second trimester: Secondary | ICD-10-CM | POA: Insufficient documentation

## 2024-02-17 DIAGNOSIS — Z79620 Long term (current) use of immunosuppressive biologic: Secondary | ICD-10-CM | POA: Diagnosis not present

## 2024-02-17 DIAGNOSIS — Z3A19 19 weeks gestation of pregnancy: Secondary | ICD-10-CM

## 2024-02-17 DIAGNOSIS — O358XX0 Maternal care for other (suspected) fetal abnormality and damage, not applicable or unspecified: Secondary | ICD-10-CM | POA: Diagnosis not present

## 2024-02-17 DIAGNOSIS — Z363 Encounter for antenatal screening for malformations: Secondary | ICD-10-CM | POA: Insufficient documentation

## 2024-02-17 DIAGNOSIS — K5090 Crohn's disease, unspecified, without complications: Secondary | ICD-10-CM

## 2024-02-17 NOTE — Progress Notes (Unsigned)
 Patient information  Patient Name: Amber Gibson  Patient MRN:   [id number]  Referring practice: MFM Referring Provider: [facility] ([facility])  Problem List   Patient Active Problem List   Diagnosis Date Noted   H/O preterm delivery, currently pregnant, second trimester 12/30/2023   Hidradenitis suppurativa 10/06/2022   Crohn's disease of small intestine without complication (HCC)    Thalassemic syndrome 09/07/2019   Maternal Fetal Medicine Consult Mckell CROME Schreiner is a 23 y.o. G2P0101 at [redacted]w[redacted]d here for ultrasound and consultation. She had Low risk aneuploidy screening of a female fetus. Carrier screening was alpha thalassemia carrier. Maternal serum AFP n/a. She has no acute concerns.   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  Now she is getting injections every 8 weeks instead of every 4 weeks.  She is aware of possible B-cell depletion in the newborn with medication to close to delivery.  Due to this her rheumatologist recommended injections every 8 weeks.  We discussed the impact of Crohn's disease on pregnancy in pregnancy on Crohn's disease.  I encouraged the patient that since her disease appears to be relatively well-controlled she should not have any significant complications but growth ultrasounds will be performed to monitor the fetus with antenatal testing reserved if there is concern for growth restriction.  Alpha thalassemia carrier: I discussed the importance of having the FOB testing.   History of preterm birth at 35.6 days: Per the patient this was spontaneous. Since it was > 34w, no intervention is available at this time and the cervical length is normal today. She will continue to monitor for any signs or symptoms of preterm labor (none currently).   Sonographic findings Single intrauterine pregnancy at 19w 3d. Fetal cardiac  activity:  Observed and appears normal. Presentation: Cephalic. The anatomic structures that were well seen appear normal without evidence of soft markers. The anatomic survey is complete.  Fetal biometry shows the estimated fetal weight at the 79 percentile. Amniotic fluid: Subjectively upper-normal.  MVP: 9.24 cm. Placenta: Posterior. Adnexa: No adnexal mass visualized. Cervical length: 3.86 cm.  There are limitations of prenatal ultrasound such as the inability to detect certain abnormalities due to poor visualization. Various factors such as fetal position, gestational age and maternal body habitus may increase the difficulty in visualizing the fetal anatomy.    Recommendations - EDD should be 07/10/2024 based on  LMP  (10/04/23). - FOB to have carrier screening. OB provider to arrange. - Anatomy ultrasound was done today with the above findings (see report). - Aspirin 81-162 mg from 12 weeks and continued throughout the pregnancy for preeclampsia prophylaxis. - Continue Stelara  per GI (last dose will be 6-8 weeks prior to delivery).  - Serial growth ultrasounds every 4-6 weeks starting at 28 weeks until delivery. - Antenatal testing (usually weekly BPP or NST) weekly at 32 weeks until delivery. - Delivery likely around 39-[redacted] weeks gestation or sooner if indicated.  Review of Systems: A review of systems was performed and was negative except per HPI   Past Obstetrical History:  OB History  Gravida Para Term Preterm AB Living  2 1 0 1 0 1  SAB IAB Ectopic Multiple Live Births  0 0 0 0 1    # Outcome Date GA Lbr Len/2nd Weight Sex Type Anes PTL Lv  2 Current  1 Preterm 09/09/19 [redacted]w[redacted]d 16:01 / 00:20 5 lb 4 oz (2.381 kg) F Vag-Spont EPI  LIV     Past Medical History:  Past Medical History:  Diagnosis Date   Crohn disease (HCC)    Menometrorrhagia    Orthostatic proteinuria    Vitamin D  deficiency      Past Surgical History:    Past Surgical History:  Procedure  Laterality Date   COLONOSCOPY WITH PROPOFOL  N/A 01/15/2022   Procedure: COLONOSCOPY WITH PROPOFOL ;  Surgeon: Unk Corinn Skiff, MD;  Location: [facility] ENDOSCOPY;  Service: Gastroenterology;  Laterality: N/A;   ESOPHAGOGASTRODUODENOSCOPY (EGD) WITH PROPOFOL  N/A 06/08/2022   Procedure: ESOPHAGOGASTRODUODENOSCOPY (EGD) WITH PROPOFOL ;  Surgeon: Unk Corinn Skiff, MD;  Location: [facility] ENDOSCOPY;  Service: Gastroenterology;  Laterality: N/A;   GIVENS CAPSULE STUDY N/A 12/21/2020   Procedure: GIVENS CAPSULE STUDY;  Surgeon: Unk Corinn Skiff, MD;  Location: [facility] ENDOSCOPY;  Service: Gastroenterology;  Laterality: N/A;     Home Medications:   Current Outpatient Medications on File Prior to Visit  Medication Sig Dispense Refill   budesonide  (ENTOCORT EC ) 3 MG 24 hr capsule TAKE 3 CAPSULES (9 MG TOTAL) BY MOUTH DAILY. (Patient not taking: Reported on 01/06/2024) 90 capsule 0   cholestyramine  (QUESTRAN ) 4 g packet TAKE 1 PACKET DAILY BY MOUTH 90 packet 1   Clobetasol Propionate 0.05 % shampoo Apply topically.     Deucravacitinib (SOTYKTU) 6 MG TABS Take by mouth.     Doxylamine -Pyridoxine  10-10 MG TBEC Take 2 (two) tablets by mouth at bedtime on Day 1 and 2; if symptoms persist, take 1 (one) tablet in morning and 2 (two) tablets at bedtime on day 3; If symptoms persist, may increase to MAX 4 (four) tablets per day, administered as 1(one) tablet in morning, 1 (one) tablet in mid-afternoon and 2 (two) tablets at bedtime. 60 tablet 0   mometasone (ELOCON) 0.1 % cream Apply 1 Application topically daily.     ondansetron  (ZOFRAN ) 4 MG tablet Take 4 mg by mouth every 8 (eight) hours as needed for nausea or vomiting.     ondansetron  (ZOFRAN ) 8 MG tablet Take 8 mg by mouth.     Prenatal Vit-Fe Fumarate-FA (PRENATAL MULTIVITAMIN) TABS tablet Take 1 tablet by mouth daily at 12 noon.     promethazine  (PHENERGAN ) 25 MG tablet Take 1 tablet (25 mg total) by mouth every 6 (six) hours as needed for nausea or vomiting.  30 tablet 1   scopolamine (TRANSDERM-SCOP) 1 MG/3DAYS Place 1 patch onto the skin.     ustekinumab  (STELARA ) 90 MG/ML SOSY injection Inject 1 mL (90 mg total) into the skin every 8 (eight) weeks. (Patient not taking: Reported on 01/06/2024) 1 mL 5   No current facility-administered medications on file prior to visit.      Allergies:   Allergies  Allergen Reactions   Pineapple      Physical Exam:   Vitals:   02/17/24 1302  BP: 112/63   Sitting comfortably on the sonogram table Nonlabored breathing Normal rate and rhythm Abdomen is nontender  Thank you for the opportunity to be involved with this patient's care. Please let us  know if we can be of any further assistance.   30 minutes of time was spent reviewing the patient's chart including labs, imaging and documentation.  At least 50% of this time was spent with direct patient care discussing the diagnosis, management and prognosis of her care.  Epifania Littrell Fetterolf MFM, [facility]   02/17/2024  3:34 PM

## 2024-04-17 ENCOUNTER — Other Ambulatory Visit: Payer: Self-pay | Admitting: *Deleted

## 2024-04-17 ENCOUNTER — Ambulatory Visit: Attending: Maternal & Fetal Medicine

## 2024-04-17 ENCOUNTER — Ambulatory Visit (HOSPITAL_BASED_OUTPATIENT_CLINIC_OR_DEPARTMENT_OTHER): Admitting: Obstetrics

## 2024-04-17 DIAGNOSIS — K5090 Crohn's disease, unspecified, without complications: Secondary | ICD-10-CM | POA: Diagnosis not present

## 2024-04-17 DIAGNOSIS — O09892 Supervision of other high risk pregnancies, second trimester: Secondary | ICD-10-CM

## 2024-04-17 DIAGNOSIS — O99619 Diseases of the digestive system complicating pregnancy, unspecified trimester: Secondary | ICD-10-CM | POA: Insufficient documentation

## 2024-04-17 DIAGNOSIS — O09893 Supervision of other high risk pregnancies, third trimester: Secondary | ICD-10-CM | POA: Insufficient documentation

## 2024-04-17 DIAGNOSIS — O403XX0 Polyhydramnios, third trimester, not applicable or unspecified: Secondary | ICD-10-CM

## 2024-04-17 DIAGNOSIS — O99412 Diseases of the circulatory system complicating pregnancy, second trimester: Secondary | ICD-10-CM | POA: Diagnosis present

## 2024-04-17 DIAGNOSIS — O409XX0 Polyhydramnios, unspecified trimester, not applicable or unspecified: Secondary | ICD-10-CM

## 2024-04-17 DIAGNOSIS — O99613 Diseases of the digestive system complicating pregnancy, third trimester: Secondary | ICD-10-CM | POA: Diagnosis present

## 2024-04-17 DIAGNOSIS — O358XX0 Maternal care for other (suspected) fetal abnormality and damage, not applicable or unspecified: Secondary | ICD-10-CM | POA: Diagnosis present

## 2024-04-17 DIAGNOSIS — K5000 Crohn's disease of small intestine without complications: Secondary | ICD-10-CM

## 2024-04-17 DIAGNOSIS — D563 Thalassemia minor: Secondary | ICD-10-CM

## 2024-04-17 DIAGNOSIS — Z3A28 28 weeks gestation of pregnancy: Secondary | ICD-10-CM | POA: Diagnosis not present

## 2024-04-17 NOTE — Addendum Note (Signed)
 Addended by: ILEANA BABARA RUSHIE STEFFAN on: 04/17/2024 10:21 AM   Modules accepted: Level of Service

## 2024-04-17 NOTE — Progress Notes (Addendum)
 MFM Consult Note  Amber Gibson is currently at 28 weeks and 0 days.  She has been followed due to maternal Crohn's disease that is currently treated with Stelara .  On today's exam, the overall EFW of 2 pounds 9 ounces measures at the 41st percentile for her gestational age.    Mild polyhydramnios with a total AFI of 25.53 cm was noted today.  Due to mild polyhydramnios noted today, she should be screened for gestational diabetes at her next prenatal visit.  The patient was advised to discuss the timing of the last dose of Stelara  with her GI doctor.  She was advised that usually, the last dose of Stelara  should be given about 6 weeks before her delivery.    A follow-up growth scan was scheduled in our office in 5 weeks.    The patient stated that all of her questions were answered today.  A total of 20 minutes was spent counseling and coordinating the care for this patient.  Greater than 50% of the time was spent in direct face-to-face contact.

## 2024-05-22 ENCOUNTER — Ambulatory Visit

## 2024-05-22 ENCOUNTER — Ambulatory Visit: Attending: Obstetrics | Admitting: Obstetrics and Gynecology

## 2024-05-22 VITALS — BP 107/64 | HR 94

## 2024-05-22 DIAGNOSIS — Z362 Encounter for other antenatal screening follow-up: Secondary | ICD-10-CM | POA: Insufficient documentation

## 2024-05-22 DIAGNOSIS — O99013 Anemia complicating pregnancy, third trimester: Secondary | ICD-10-CM | POA: Diagnosis not present

## 2024-05-22 DIAGNOSIS — O35BXX0 Maternal care for other (suspected) fetal abnormality and damage, fetal cardiac anomalies, not applicable or unspecified: Secondary | ICD-10-CM

## 2024-05-22 DIAGNOSIS — K5000 Crohn's disease of small intestine without complications: Secondary | ICD-10-CM | POA: Diagnosis not present

## 2024-05-22 DIAGNOSIS — O409XX0 Polyhydramnios, unspecified trimester, not applicable or unspecified: Secondary | ICD-10-CM

## 2024-05-22 DIAGNOSIS — O99613 Diseases of the digestive system complicating pregnancy, third trimester: Secondary | ICD-10-CM | POA: Insufficient documentation

## 2024-05-22 DIAGNOSIS — O09293 Supervision of pregnancy with other poor reproductive or obstetric history, third trimester: Secondary | ICD-10-CM | POA: Insufficient documentation

## 2024-05-22 DIAGNOSIS — D563 Thalassemia minor: Secondary | ICD-10-CM | POA: Diagnosis not present

## 2024-05-22 DIAGNOSIS — Z3A33 33 weeks gestation of pregnancy: Secondary | ICD-10-CM | POA: Insufficient documentation

## 2024-05-22 DIAGNOSIS — O403XX0 Polyhydramnios, third trimester, not applicable or unspecified: Secondary | ICD-10-CM | POA: Insufficient documentation

## 2024-05-22 DIAGNOSIS — K5090 Crohn's disease, unspecified, without complications: Secondary | ICD-10-CM | POA: Diagnosis not present

## 2024-05-22 DIAGNOSIS — O09892 Supervision of other high risk pregnancies, second trimester: Secondary | ICD-10-CM

## 2024-05-22 NOTE — Progress Notes (Signed)
 Maternal-Fetal Medicine Consultation  Name: Amber Gibson  MRN: [id number]  GA: [id number] [redacted]w[redacted]d   Maternal Crohn's disease.  Patient takes Stelara  injections and her next injection is due in the middle of October.  She had flares last month (nausea and diarrhea).  She does not have gestational diabetes.  Blood pressure today at our office is 107/64 mmHg. Obstetrical history significant for a preterm vaginal delivery in 2021 of a female infant weighing 5 pounds and 4 ounces at birth.  Polyhydramnios was seen on previous ultrasound.  Ultrasound Fetal growth is appropriate for gestational age.  Amniotic fluid is normal and good fetal activity seen.  Cephalic presentation.  I reassured the patient that polyhydramnios had resolved.  Maternal Crohn's disease and history of preterm delivery increases the risk of recurrent preterm delivery. Theoretically, biologics can interfere with antibody response to vaccination in infants.  Based on the available data including the [place] national vaccination program, anti-TNF therapy treatment does not impair antibody response to nonlive vaccination.  Nonlive vaccination can be given as usual in infants exposed in utero to anti-TNF medications.  Live vaccination should be deferred for at least 6 months.  I recommended that she take her last dose of stelara  in the first week of October. Pediatrician should be consulted before vaccination.  Recommendations -No follow-up appointments were made.     Consultation including face-to-face (more than 50%) counseling 20 minutes.

## 2024-06-21 ENCOUNTER — Other Ambulatory Visit: Payer: Self-pay

## 2024-06-21 ENCOUNTER — Inpatient Hospital Stay (HOSPITAL_COMMUNITY)
Admission: AD | Admit: 2024-06-21 | Discharge: 2024-06-23 | DRG: 797 | Disposition: A | Discharge status: Home / Self Care | Attending: Obstetrics and Gynecology | Admitting: Obstetrics and Gynecology

## 2024-06-21 ENCOUNTER — Encounter (HOSPITAL_COMMUNITY): Payer: Self-pay | Admitting: Obstetrics and Gynecology

## 2024-06-21 DIAGNOSIS — K5000 Crohn's disease of small intestine without complications: Secondary | ICD-10-CM | POA: Diagnosis present

## 2024-06-21 DIAGNOSIS — Z148 Genetic carrier of other disease: Secondary | ICD-10-CM | POA: Diagnosis not present

## 2024-06-21 DIAGNOSIS — Z3A37 37 weeks gestation of pregnancy: Secondary | ICD-10-CM | POA: Diagnosis not present

## 2024-06-21 DIAGNOSIS — Z9851 Tubal ligation status: Secondary | ICD-10-CM

## 2024-06-21 DIAGNOSIS — O9902 Anemia complicating childbirth: Secondary | ICD-10-CM | POA: Diagnosis present

## 2024-06-21 DIAGNOSIS — O26893 Other specified pregnancy related conditions, third trimester: Secondary | ICD-10-CM | POA: Diagnosis present

## 2024-06-21 DIAGNOSIS — O9962 Diseases of the digestive system complicating childbirth: Secondary | ICD-10-CM | POA: Diagnosis present

## 2024-06-21 DIAGNOSIS — Z8249 Family history of ischemic heart disease and other diseases of the circulatory system: Secondary | ICD-10-CM

## 2024-06-21 DIAGNOSIS — Z302 Encounter for sterilization: Secondary | ICD-10-CM

## 2024-06-21 DIAGNOSIS — Z833 Family history of diabetes mellitus: Secondary | ICD-10-CM | POA: Diagnosis not present

## 2024-06-21 LAB — TYPE AND SCREEN
ABO/RH(D): O POS
Antibody Screen: NEGATIVE

## 2024-06-21 MED ORDER — OXYTOCIN 10 UNIT/ML IJ SOLN
INTRAMUSCULAR | Status: AC
  Filled 2024-06-21: qty 1

## 2024-06-21 MED ORDER — ONDANSETRON HCL 4 MG/2ML IJ SOLN
4.0000 mg | Freq: Four times a day (QID) | INTRAMUSCULAR | Status: DC | PRN
Start: 2024-06-21 — End: 2024-06-21

## 2024-06-21 MED ORDER — LACTATED RINGERS IV SOLN: 500.0000 mL | INTRAVENOUS | Status: DC | PRN

## 2024-06-21 MED ORDER — BENZOCAINE-MENTHOL 20-0.5 % EX AERO
1.0000 | INHALATION_SPRAY | CUTANEOUS | Status: DC | PRN
  Filled 2024-06-21: qty 56

## 2024-06-21 MED ORDER — TETANUS-DIPHTH-ACELL PERTUSSIS 5-2-15.5 LF-MCG/0.5 IM SUSP: 0.5000 mL | Freq: Once | INTRAMUSCULAR | Status: DC

## 2024-06-21 MED ORDER — SIMETHICONE 80 MG PO CHEW: 80.0000 mg | CHEWABLE_TABLET | ORAL | Status: DC | PRN

## 2024-06-21 MED ORDER — SOD CITRATE-CITRIC ACID 500-334 MG/5ML PO SOLN: 30.0000 mL | ORAL | Status: DC | PRN

## 2024-06-21 MED ORDER — IBUPROFEN 600 MG PO TABS
600.0000 mg | ORAL_TABLET | Freq: Four times a day (QID) | ORAL | Status: DC
  Administered 2024-06-21 – 2024-06-23 (×9): 600 mg via ORAL
  Filled 2024-06-21 (×10): qty 1

## 2024-06-21 MED ORDER — OXYTOCIN-SODIUM CHLORIDE 30-0.9 UT/500ML-% IV SOLN
2.5000 [IU]/h | INTRAVENOUS | Status: DC
Start: 2024-06-21 — End: 2024-06-21

## 2024-06-21 MED ORDER — DIPHENHYDRAMINE HCL 25 MG PO CAPS: 25.0000 mg | ORAL_CAPSULE | Freq: Four times a day (QID) | ORAL | Status: DC | PRN

## 2024-06-21 MED ORDER — PRENATAL MULTIVITAMIN CH
1.0000 | ORAL_TABLET | Freq: Every day | ORAL | Status: DC
  Administered 2024-06-22: 1 via ORAL
  Filled 2024-06-21: qty 1

## 2024-06-21 MED ORDER — ACETAMINOPHEN 325 MG PO TABS: 650.0000 mg | ORAL_TABLET | ORAL | Status: DC | PRN

## 2024-06-21 MED ORDER — SENNOSIDES-DOCUSATE SODIUM 8.6-50 MG PO TABS
2.0000 | ORAL_TABLET | Freq: Every day | ORAL | Status: DC
  Administered 2024-06-22: 2 via ORAL
  Filled 2024-06-21: qty 2

## 2024-06-21 MED ORDER — OXYCODONE-ACETAMINOPHEN 5-325 MG PO TABS: 1.0000 | ORAL_TABLET | ORAL | Status: DC | PRN

## 2024-06-21 MED ORDER — WITCH HAZEL-GLYCERIN EX PADS: 1.0000 | MEDICATED_PAD | CUTANEOUS | Status: DC | PRN

## 2024-06-21 MED ORDER — OXYCODONE-ACETAMINOPHEN 5-325 MG PO TABS: 2.0000 | ORAL_TABLET | ORAL | Status: DC | PRN

## 2024-06-21 MED ORDER — ONDANSETRON HCL 4 MG/2ML IJ SOLN: 4.0000 mg | INTRAMUSCULAR | Status: DC | PRN

## 2024-06-21 MED ORDER — ZOLPIDEM TARTRATE 5 MG PO TABS: 5.0000 mg | ORAL_TABLET | Freq: Every evening | ORAL | Status: DC | PRN

## 2024-06-21 MED ORDER — DIBUCAINE (PERIANAL) 1 % EX OINT: 1.0000 | TOPICAL_OINTMENT | CUTANEOUS | Status: DC | PRN

## 2024-06-21 MED ORDER — ACETAMINOPHEN 325 MG PO TABS
650.0000 mg | ORAL_TABLET | ORAL | Status: DC | PRN
  Administered 2024-06-21 – 2024-06-23 (×3): 650 mg via ORAL
  Filled 2024-06-21 (×4): qty 2

## 2024-06-21 MED ORDER — LIDOCAINE HCL (PF) 1 % IJ SOLN: 30.0000 mL | INTRAMUSCULAR | Status: DC | PRN

## 2024-06-21 MED ORDER — ONDANSETRON HCL 4 MG PO TABS: 4.0000 mg | ORAL_TABLET | ORAL | Status: DC | PRN

## 2024-06-21 MED ORDER — OXYTOCIN BOLUS FROM INFUSION: 333.0000 mL | Freq: Once | INTRAVENOUS | Status: DC

## 2024-06-21 MED ORDER — LACTATED RINGERS IV SOLN: INTRAVENOUS | Status: DC

## 2024-06-21 MED ORDER — OXYTOCIN 10 UNIT/ML IJ SOLN
10.0000 [IU] | Freq: Once | INTRAMUSCULAR | Status: AC
  Administered 2024-06-21: 10 [IU] via INTRAMUSCULAR

## 2024-06-21 MED ORDER — COCONUT OIL OIL: 1.0000 | TOPICAL_OIL | Status: DC | PRN

## 2024-06-21 NOTE — H&P (Addendum)
 Amber Gibson is a 23 y.o. female, G2P0101, IUP at 37.2 weeks, presenting for acitve labor, cxt started at 0400 this morning, and progress to 10cm in MAU upon arrival with SROM , clear. GBS unknwon with fast del, no tx, can call [facility] in monring for GBS results) H/O Chrons (Dx 2021, followed at [facility].  Referral to MFM for management recommendations during pregnancy.  OK to continue Stelara  until 6-8 weeks prior to delivery, follow growth at MFM.). Alpha thal trait (FOB declined testing). Anemia. Vit D Def. Desire sterilization for PP contraception (consent signed 05/22/2024). Pt endorse + Fm.    Patient Active Problem List   Diagnosis Date Noted   Indication for care in labor or delivery 06/21/2024   SVD (spontaneous vaginal delivery) 06/21/2024   Normal postpartum course 06/21/2024   H/O preterm delivery, currently pregnant, second trimester 12/30/2023   Hidradenitis suppurativa 10/06/2022   Crohn's disease of small intestine without complication (HCC)    Alpha thalassemia silent carrier 09/07/2019     Active Ambulatory Problems    Diagnosis Date Noted   Alpha thalassemia silent carrier 09/07/2019   Crohn's disease of small intestine without complication (HCC)    Hidradenitis suppurativa 10/06/2022   H/O preterm delivery, currently pregnant, second trimester 12/30/2023   Resolved Ambulatory Problems    Diagnosis Date Noted   Menometrorrhagia 07/14/2015   Well adolescent visit 07/14/2015   Need for HPV vaccine 07/19/2015   Abnormal presence of protein in urine 07/07/2014   Supervision of normal first pregnancy, antepartum 03/04/2019   Indication for care in labor and delivery, antepartum 09/09/2019   SVD (spontaneous vaginal delivery) 09/12/2019   Postpartum care following vaginal delivery 09/12/2019   Urticaria 01/21/2020   Acne 01/21/2020   Dyspepsia    Abdominal bloating    Past Medical History:  Diagnosis Date   Crohn disease (HCC)    Orthostatic  proteinuria    Vitamin D  deficiency       Medications Prior to Admission  Medication Sig Dispense Refill Last Dose/Taking   budesonide  (ENTOCORT EC ) 3 MG 24 hr capsule TAKE 3 CAPSULES (9 MG TOTAL) BY MOUTH DAILY. (Patient not taking: Reported on 01/06/2024) 90 capsule 0    cholestyramine  (QUESTRAN ) 4 g packet TAKE 1 PACKET DAILY BY MOUTH 90 packet 1    Clobetasol Propionate 0.05 % shampoo Apply topically.      Deucravacitinib (SOTYKTU) 6 MG TABS Take by mouth.      Doxylamine -Pyridoxine  10-10 MG TBEC Take 2 (two) tablets by mouth at bedtime on Day 1 and 2; if symptoms persist, take 1 (one) tablet in morning and 2 (two) tablets at bedtime on day 3; If symptoms persist, may increase to MAX 4 (four) tablets per day, administered as 1(one) tablet in morning, 1 (one) tablet in mid-afternoon and 2 (two) tablets at bedtime. 60 tablet 0    mometasone (ELOCON) 0.1 % cream Apply 1 Application topically daily.      ondansetron  (ZOFRAN ) 4 MG tablet Take 4 mg by mouth every 8 (eight) hours as needed for nausea or vomiting.      ondansetron  (ZOFRAN ) 8 MG tablet Take 8 mg by mouth.      Prenatal Vit-Fe Fumarate-FA (PRENATAL MULTIVITAMIN) TABS tablet Take 1 tablet by mouth daily at 12 noon.      promethazine  (PHENERGAN ) 25 MG tablet Take 1 tablet (25 mg total) by mouth every 6 (six) hours as needed for nausea or vomiting. 30 tablet 1    scopolamine (  TRANSDERM-SCOP) 1 MG/3DAYS Place 1 patch onto the skin.      ustekinumab  (STELARA ) 90 MG/ML SOSY injection Inject 1 mL (90 mg total) into the skin every 8 (eight) weeks. (Patient not taking: Reported on 01/06/2024) 1 mL 5     Past Medical History:  Diagnosis Date   Crohn disease (HCC)    Menometrorrhagia    Orthostatic proteinuria    Vitamin D  deficiency      No current facility-administered medications on file prior to encounter.   Current Outpatient Medications on File Prior to Encounter  Medication Sig Dispense Refill   budesonide  (ENTOCORT EC ) 3 MG 24  hr capsule TAKE 3 CAPSULES (9 MG TOTAL) BY MOUTH DAILY. (Patient not taking: Reported on 01/06/2024) 90 capsule 0   cholestyramine  (QUESTRAN ) 4 g packet TAKE 1 PACKET DAILY BY MOUTH 90 packet 1   Clobetasol Propionate 0.05 % shampoo Apply topically.     Deucravacitinib (SOTYKTU) 6 MG TABS Take by mouth.     Doxylamine -Pyridoxine  10-10 MG TBEC Take 2 (two) tablets by mouth at bedtime on Day 1 and 2; if symptoms persist, take 1 (one) tablet in morning and 2 (two) tablets at bedtime on day 3; If symptoms persist, may increase to MAX 4 (four) tablets per day, administered as 1(one) tablet in morning, 1 (one) tablet in mid-afternoon and 2 (two) tablets at bedtime. 60 tablet 0   mometasone (ELOCON) 0.1 % cream Apply 1 Application topically daily.     ondansetron  (ZOFRAN ) 4 MG tablet Take 4 mg by mouth every 8 (eight) hours as needed for nausea or vomiting.     ondansetron  (ZOFRAN ) 8 MG tablet Take 8 mg by mouth.     Prenatal Vit-Fe Fumarate-FA (PRENATAL MULTIVITAMIN) TABS tablet Take 1 tablet by mouth daily at 12 noon.     promethazine  (PHENERGAN ) 25 MG tablet Take 1 tablet (25 mg total) by mouth every 6 (six) hours as needed for nausea or vomiting. 30 tablet 1   scopolamine (TRANSDERM-SCOP) 1 MG/3DAYS Place 1 patch onto the skin.     ustekinumab  (STELARA ) 90 MG/ML SOSY injection Inject 1 mL (90 mg total) into the skin every 8 (eight) weeks. (Patient not taking: Reported on 01/06/2024) 1 mL 5     Allergies  Allergen Reactions   Pineapple     History of present pregnancy: Pt Info/Preference:  Screening/Consents:  Labs:   EDD: Estimated Date of Delivery: 07/10/24  Establised: Patient's last menstrual period was 10/04/2023 (exact date).  Anatomy Scan: Date: 19.4 weeks Placenta Location: posterior Genetic Screen: Panoroma:LR AFP:  First Tri: Quad:  Office: [facility]            First PNV: 9.6 weeks Blood Type  O+  Language: english Last PNV: 37 weeks Rhogam  N/a  Flu Vaccine:  declined   Antibody  Neg   TDaP vaccine utd   GTT: Early: 5.1 Third Trimester: 103  Feeding Plan: breast BTL: Consent signed 9/26 for interval tubal  Rubella:  Immune  Contraception: BTL desired VBAC: no RPR:   NR  Circumcision: Yes in pt    HBsAg:  Neg  Pediatrician:  ???   HIV:   Neg  Prenatal Classes: no Additional US :  MFM 9/26--vtx, posterior placenta, FHR 127, EFW 19.27, 72%ile, EFW 4+9, 36%ile, GBS:  Unknown (For PCN allergy, check sensitivities)       Chlamydia: neg    MFM Referral/Consult:  GC: neg  Support Person: partner   PAP: 2022 WNL  Pain Management: none Neonatologist  Referral:  Hgb Electrophoresis:  AA/altha thal  Birth Plan: DCC   Hgb NOB: 13.3    28W: 10.8   OB History     Gravida  2   Para  1   Term  0   Preterm  1   AB  0   Living  1      SAB  0   IAB  0   Ectopic  0   Multiple  0   Live Births  1          Past Medical History:  Diagnosis Date   Crohn disease (HCC)    Menometrorrhagia    Orthostatic proteinuria    Vitamin D  deficiency    Past Surgical History:  Procedure Laterality Date   COLONOSCOPY WITH PROPOFOL  N/A 01/15/2022   Procedure: COLONOSCOPY WITH PROPOFOL ;  Surgeon: Unk Corinn Skiff, MD;  Location: [facility] ENDOSCOPY;  Service: Gastroenterology;  Laterality: N/A;   ESOPHAGOGASTRODUODENOSCOPY (EGD) WITH PROPOFOL  N/A 06/08/2022   Procedure: ESOPHAGOGASTRODUODENOSCOPY (EGD) WITH PROPOFOL ;  Surgeon: Unk Corinn Skiff, MD;  Location: [facility] ENDOSCOPY;  Service: Gastroenterology;  Laterality: N/A;   GIVENS CAPSULE STUDY N/A 12/21/2020   Procedure: GIVENS CAPSULE STUDY;  Surgeon: Unk Corinn Skiff, MD;  Location: [facility] ENDOSCOPY;  Service: Gastroenterology;  Laterality: N/A;   Family History: family history includes COPD in her maternal grandmother; Diabetes in her maternal grandfather and maternal grandmother; Heart disease in her maternal grandfather and maternal grandmother; Hypertension in her maternal grandfather and maternal grandmother; Lupus in her  maternal grandmother. Social History:  reports that she has never smoked. She has never used smokeless tobacco. She reports that she does not drink alcohol and does not use drugs.   Prenatal Transfer Tool  Maternal Diabetes: No Genetic Screening: Normal Maternal Ultrasounds/Referrals: Normal Fetal Ultrasounds or other Referrals:  None Maternal Substance Abuse:  No Significant Maternal Medications:  Meds include: Other: Stelara  Significant Maternal Lab Results: Other: GBS unknown collected in office at 37 can Call [facility] in the morning to find lab results.   ROS:  Review of Systems  Constitutional: Negative.   HENT: Negative.    Eyes: Negative.   Respiratory: Negative.    Cardiovascular: Negative.   Gastrointestinal:  Positive for abdominal pain.  Genitourinary:        In MAU leakage of fluid   Skin: Negative.   Neurological: Negative.   Endo/Heme/Allergies: Negative.   Psychiatric/Behavioral: Negative.       Physical Exam: BP 124/82   Pulse 91   Temp 98.1 F (36.7 C) (Oral)   Resp 16   LMP 10/04/2023 (Exact Date)   Physical Exam Vitals and nursing note reviewed.  Constitutional:      Appearance: Normal appearance.  HENT:     Head: Normocephalic.     Nose: Nose normal.     Mouth/Throat:     Pharynx: Oropharynx is clear.  Eyes:     Extraocular Movements: Extraocular movements intact.     Conjunctiva/sclera: Conjunctivae normal.  Cardiovascular:     Rate and Rhythm: Normal rate and regular rhythm.     Pulses: Normal pulses.     Heart sounds: Normal heart sounds.  Pulmonary:     Effort: Pulmonary effort is normal.     Breath sounds: Normal breath sounds.  Abdominal:     General: Bowel sounds are normal.  Musculoskeletal:        General: Normal range of motion.     Cervical back: Normal range of motion and neck supple.  Skin:    General: Skin is warm and dry.     Capillary Refill: Capillary refill takes less than 2 seconds.  Neurological:     General: No focal  deficit present.     Mental Status: She is alert.  Psychiatric:        Mood and Affect: Mood normal.      NST: FHR baseline 135 bpm, Variability: moderate, Accelerations:present, Decelerations:  Absent= Cat 1/Reactive UC:   regular, every 2-3 minutes SVE:   Dilation: 10 Effacement (%): 100 Station: Plus 1 Exam by:: K. Cowher RN, vertex verified by fetal sutures.  Leopold's: Position vertex, EFW 6.5lbs via leopold's.   Labs: No results found for this or any previous visit (from the past 24 hours).  Imaging:  US  MFM OB FOLLOW UP Result Date: 05/22/2024 ----------------------------------------------------------------------  OBSTETRICS REPORT                       (Signed Final 05/22/2024 02:02 pm) ---------------------------------------------------------------------- Patient Info  ID #:       [id number]                          D.O.B.:  [birth year] (23 yrs)(F)  Name:       BENTON CROME                  Visit Date: 05/22/2024 11:18 am              Landing ---------------------------------------------------------------------- Performed By  Attending:        Fredia Fresh MD        Ref. Address:     [facility]                                                             [facility]                                                             [address]                                                             [address]  [place], [place]                                                             [address]  Performed By:     Vinetta Dawn RDMS      [facility]  Referred By:       ORIE BONUS                    CNM ---------------------------------------------------------------------- Orders  #  Description                           Code        Ordered By  1  US  MFM OB FOLLOW UP                   23183.98    BABARA KEYS ----------------------------------------------------------------------  #  Order #                     Accession #                Episode #  1  [id number]                   [id number]                 [id number] ---------------------------------------------------------------------- Indications  Polyhydramnios, third trimester, antepartum    O40.3XX0  condition or complication, unspecified  fetus(resolved)  Maternal Crohn's disease affecting             O99.613  pregnancy in third trimester  Medical complication of pregnancy (Alpha       O26.90  Thalassemia Trait)  Poor obstetric history: Previous preterm       O09.219  delivery, antepartum ([redacted]w[redacted]d)  Echogenic intracardiac focus of the heart      O35.8XX0  (EIF)  [redacted] weeks gestation of pregnancy                Z3A.33  Encounter for other antenatal screening        Z36.2  follow-up  LR NIPS - Female ---------------------------------------------------------------------- Vital  Signs  BP:          107/64 ---------------------------------------------------------------------- Fetal Evaluation  Num Of Fetuses:         1  Fetal Heart Rate(bpm):  127  Cardiac Activity:       Observed  Presentation:           Cephalic  Placenta:               Posterior  P. Cord Insertion:      Previously seen  Amniotic Fluid  AFI FV:      Within normal limits  AFI Sum(cm)     %Tile       Largest Pocket(cm)  19.27           72          6.81  RUQ(cm)       RLQ(cm)       LUQ(cm)        LLQ(cm)  3.46          4.62          6.81           4.38 ---------------------------------------------------------------------- Biometry  BPD:      84.8  mm     G. Age:  34w 1d         77  %    CI:         81.1   %    70 - 86                                                           FL/HC:      21.4   %    19.9 - 21.5  HC:      297.3  mm     G. Age:  32w 6d         13  %    HC/AC:      1.04        0.96 - 1.11  AC:      286.1  mm     G. Age:  32w 4d         40  %    FL/BPD:     75.0   %    71 - 87  FL:       63.6  mm     G. Age:  32w 6d         35  %    FL/AC:      22.2   %    20 - 24  Est. FW:    2066  gm      4 lb 9 oz     36  % ---------------------------------------------------------------------- OB History  Gravidity:    2         Term:   1 ---------------------------------------------------------------------- Gestational Age  LMP:           33w 0d        Date:  10/04/23                   EDD:   07/10/24  U/S Today:     33w 1d  EDD:   07/09/24  Best:          33w 0d     Det. By:  LMP  (10/04/23)          EDD:   07/10/24 ---------------------------------------------------------------------- Anatomy  Cranium:               Previously seen        Aortic Arch:            Previously seen  Cavum:                 Previously seen        Ductal Arch:            Previously seen  Ventricles:            Appears normal         Diaphragm:              Appears normal  Choroid Plexus:        Previously seen        Stomach:                Appears normal, left                                                                        sided  Cerebellum:            Previously seen        Abdomen:                Previously seen  Posterior Fossa:       Previously seen        Abdominal Wall:         Previously seen  Face:                  Orbits and profile     Cord Vessels:           Previously seen                         previously seen  Lips:                  Previously seen        Kidneys:                Appear normal  Thoracic:              Previously seen        Bladder:                Appears normal  Heart:                 Appears normal         Spine:                  Previously seen                         (4CH, axis, and                         situs)  RVOT:  Previously seen        Upper Extremities:      Previously seen  LVOT:                  Previously seen        Lower Extremities:      Previously seen ---------------------------------------------------------------------- Cervix Uterus Adnexa  Cervix  Not visualized (advanced GA >24wks)  Uterus  No abnormality visualized.  Right Ovary  Not visualized.  Left Ovary  Not visualized.  Cul De Sac  No free fluid seen.  Adnexa  No abnormality visualized ---------------------------------------------------------------------- Impression  Maternal Crohn's disease.  Patient takes Stelara  injections  and her next injection is due in the middle of October.  She  had flares last month (nausea and diarrhea).  She does not have gestational diabetes.  Blood pressure  today at our office is 107/64 mmHg.  Obstetrical history significant for a preterm vaginal delivery in  2021 of a female infant weighing 5 pounds and 4 ounces at  birth.  Polyhydramnios was seen on previous ultrasound.  Ultrasound  Fetal growth is appropriate for gestational age.  Amniotic fluid  is normal and good fetal activity seen.  Cephalic presentation.  I reassured the patient that polyhydramnios had resolved.  Maternal Crohn's disease and history of preterm delivery  increases the risk of recurrent preterm delivery.  Theoretically, biologics can interfere with antibody response  to vaccination in infants.  Based on the available data  including the [place] national vaccination program, anti-TNF  therapy treatment does not impair antibody response to  nonlive vaccination.  Nonlive vaccination can be given as usual in infants exposed  in utero to anti-TNF medications.  Live vaccination should be  deferred for at least 6 months.  I recommended that she take her last dose of stelara  in the  first week of October. Pediatrician should be consulted before  vaccination. ---------------------------------------------------------------------- Recommendations  -No follow-up  appointments were made. ----------------------------------------------------------------------                 Fredia Fresh, MD Electronically Signed Final Report   05/22/2024 02:02 pm ----------------------------------------------------------------------    MAU Course: Orders Placed This Encounter  Procedures   CBC   RPR   Diet clear liquid Room service appropriate? Yes; Fluid consistency: Thin   Vitals signs per unit policy   Notify physician (specify)   Fetal monitoring per unit policy   Activity as tolerated   Cervical Exam   Measure blood pressure post delivery every 15 min x 1 hour then every 30 min x 1 hour   Fundal check post delivery every 15 min x 1 hour then every 30 min x 1 hour   Apply Labor & Delivery Care Plan   If Rapid HIV test positive or known HIV positive: initiate AZT orders   May in and out cath x 2 for inability to void   Insert urethral catheter X 1 PRN If Coude Catheter is chosen, qualified resources by campus can be found in the clinical skills nursing procedure for Coude Catheter 1. If straight catheterized > 2 times or patient unable to void post epidural plac...   Refer to Sidebar Report Urinary (Foley) Catheter Indications   Refer to Sidebar Report Post Indwelling Urinary Catheter Removal and Intervention Guidelines   Discontinue foley prior to vaginal delivery   Initiate Oral Care Protocol   Initiate Carrier Fluid Protocol   Full code   Type and screen [facility]   Insert  and maintain IV Line   Admit to Inpatient (patient's expected length of stay will be greater than 2 midnights or inpatient only procedure)   Transfer patient   Meds ordered this encounter  Medications   lactated ringers  infusion   FOLLOWED BY Linked Order Group    oxytocin  (PITOCIN ) IV BOLUS FROM BAG    oxytocin  (PITOCIN ) IV infusion 30 units in NS 500 mL - Premix   lactated ringers  infusion 500-1,000 mL   acetaminophen  (TYLENOL ) tablet 650 mg    oxyCODONE -acetaminophen  (PERCOCET/ROXICET) 5-325 MG per tablet 1 tablet    Refill:  0   oxyCODONE -acetaminophen  (PERCOCET/ROXICET) 5-325 MG per tablet 2 tablet    Refill:  0   ondansetron  (ZOFRAN ) injection 4 mg   sodium citrate-citric acid  (ORACIT) solution 30 mL   lidocaine  (PF) (XYLOCAINE ) 1 % injection 30 mL   oxytocin  (PITOCIN ) injection 10 Units   oxytocin  (PITOCIN ) 10 UNIT/ML injection    Manhard, Yancey L: cabinet override   ibuprofen  (ADVIL ) tablet 600 mg   acetaminophen  (TYLENOL ) tablet 650 mg    Assessment/Plan: Tolulope Pinkett Flury is a 23 y.o. female, G2P0101, IUP at 37.2 weeks, presenting for acitve labor, cxt started at 0400 this morning, and progress to 10cm in MAU upon arrival with SROM , clear. H/O Chrons (Dx 2021, followed at [facility].  Referral to MFM for management recommendations during pregnancy.  OK to continue Stelara  until 6-8 weeks prior to delivery, follow growth at MFM.). Alpha thal trait (FOB declined testing). Anemia. Vit D Def. Desire sterilization for PP contraception (consent signed 05/22/2024). Pt endorse + Fm.   FWB: Cat 1 Fetal Tracing.   Plan: Admit to [facility] Routine [facility] orders IM pitocin  PP GBS unknown but collected in the office at 37 weeks, results may be back 10/27 (fast deliver upon arrival no TX given)  Anticipate labor progression  Possible BTL PP or Interval   Lonza Shimabukuro  CNM, FNP-C, PMHNP-BC  [address]  [place], [place] [address]  Cell: [phone]  Office Phone: [phone] Fax: [phone] 06/21/2024  10:55 AM

## 2024-06-21 NOTE — Lactation Note (Signed)
 This note was copied from a baby's chart. Lactation Consultation Note  Patient Name: Amber Gibson Date: 06/21/2024 Age:25 hours  Mom chooses to formula feed.   Maternal Data    Feeding Nipple Type: Slow - flow  LATCH Score                    Lactation Tools Discussed/Used    Interventions    Discharge    Consult Status Consult Status: Complete    Alyana Kreiter G 06/21/2024, 7:21 PM

## 2024-06-21 NOTE — MAU Note (Signed)
 Amber Gibson is a 23 y.o. at [redacted]w[redacted]d here in MAU reporting: patient pulled straight back from lobby due to valet calling a medical emergency for her. Pt moved from wheelchair to bed and cervical exam performed. Pt 10/100/+1 vertex, unsure if patient is ruptured or not. Bloody show noticed on perineum. Dr Zina and JONELLE Cart CNM present at bedside. Dr. Armond notified of patient status. Called J. Montana  CNM immediately to come down from L&D for potential delivery.  L&D charge nurse notified and stated to bring patient to 217 if safe transport.   FHT:[id number]

## 2024-06-22 LAB — CBC
HCT: 33.2 % — ABNORMAL LOW (ref 36.0–46.0)
Hemoglobin: 10.8 g/dL — ABNORMAL LOW (ref 12.0–15.0)
MCH: 26.7 pg (ref 26.0–34.0)
MCHC: 32.5 g/dL (ref 30.0–36.0)
MCV: 82 fL (ref 80.0–100.0)
Platelets: 251 K/uL (ref 150–400)
RBC: 4.05 MIL/uL (ref 3.87–5.11)
RDW: 13.8 % (ref 11.5–15.5)
WBC: 11.2 K/uL — ABNORMAL HIGH (ref 4.0–10.5)
nRBC: 0 % (ref 0.0–0.2)

## 2024-06-22 LAB — RPR: RPR Ser Ql: NONREACTIVE

## 2024-06-22 MED ORDER — FAMOTIDINE 20 MG PO TABS
40.0000 mg | ORAL_TABLET | Freq: Once | ORAL | Status: AC
  Administered 2024-06-23: 40 mg via ORAL
  Filled 2024-06-22: qty 2

## 2024-06-22 MED ORDER — METOCLOPRAMIDE HCL 10 MG PO TABS
10.0000 mg | ORAL_TABLET | Freq: Once | ORAL | Status: AC
  Administered 2024-06-23: 10 mg via ORAL
  Filled 2024-06-22: qty 1

## 2024-06-22 MED ORDER — LACTATED RINGERS IV SOLN: INTRAVENOUS | Status: DC

## 2024-06-22 NOTE — Progress Notes (Addendum)
 Post Partum Day 1 Subjective: no complaints, up ad lib, voiding, tolerating PO, and desires sterilization.  Objective: Blood pressure 107/83, pulse 88, temperature 98.5 F (36.9 C), temperature source Oral, resp. rate 18, last menstrual period 10/04/2023, SpO2 100%, unknown if currently breastfeeding.  Physical Exam:  General: alert, cooperative, and no distress Lochia: appropriate Uterine Fundus: firm Incision: n/a DVT Evaluation: No evidence of DVT seen on physical exam. No cords or calf tenderness.  Recent Labs    06/22/24 0528  HGB 10.8*  HCT 33.2*    Assessment/Plan: Contraception sterilization desired.  Scheduled at 0930 tomorrow.  NPO after MN.  RBA reviewed including but not limited to bleeding infection injury risk of regret and risk of failure. Cont routine PP care   LOS: 1 day   Jon CINDERELLA Rummer, MD 06/22/2024, 10:48 AM

## 2024-06-23 ENCOUNTER — Encounter (HOSPITAL_COMMUNITY): Payer: Self-pay | Admitting: Obstetrics and Gynecology

## 2024-06-23 ENCOUNTER — Encounter (HOSPITAL_COMMUNITY): Admission: AD | Disposition: A | Payer: Self-pay | Source: Home / Self Care | Attending: Obstetrics and Gynecology

## 2024-06-23 ENCOUNTER — Inpatient Hospital Stay (HOSPITAL_COMMUNITY): Admitting: Anesthesiology

## 2024-06-23 DIAGNOSIS — Z302 Encounter for sterilization: Secondary | ICD-10-CM

## 2024-06-23 DIAGNOSIS — Z9851 Tubal ligation status: Secondary | ICD-10-CM

## 2024-06-23 HISTORY — PX: TUBAL LIGATION: SHX77

## 2024-06-23 LAB — CBC
HCT: 36.7 % (ref 36.0–46.0)
Hemoglobin: 12.1 g/dL (ref 12.0–15.0)
MCH: 27.2 pg (ref 26.0–34.0)
MCHC: 33 g/dL (ref 30.0–36.0)
MCV: 82.5 fL (ref 80.0–100.0)
Platelets: 271 K/uL (ref 150–400)
RBC: 4.45 MIL/uL (ref 3.87–5.11)
RDW: 13.7 % (ref 11.5–15.5)
WBC: 11.6 K/uL — ABNORMAL HIGH (ref 4.0–10.5)
nRBC: 0 % (ref 0.0–0.2)

## 2024-06-23 SURGERY — LIGATION, FALLOPIAN TUBE, POSTPARTUM
Anesthesia: Spinal | Laterality: Bilateral

## 2024-06-23 MED ORDER — SODIUM CHLORIDE 0.9 % IR SOLN
Status: DC | PRN
  Administered 2024-06-23: 1

## 2024-06-23 MED ORDER — AMISULPRIDE (ANTIEMETIC) 5 MG/2ML IV SOLN: 10.0000 mg | Freq: Once | INTRAVENOUS | Status: DC | PRN

## 2024-06-23 MED ORDER — ACETAMINOPHEN 325 MG PO TABS: 650.0000 mg | ORAL_TABLET | ORAL | 1 refills | Status: AC | PRN

## 2024-06-23 MED ORDER — KETOROLAC TROMETHAMINE 30 MG/ML IJ SOLN: 30.0000 mg | Freq: Once | INTRAMUSCULAR | Status: DC | PRN

## 2024-06-23 MED ORDER — FENTANYL CITRATE (PF) 100 MCG/2ML IJ SOLN: 25.0000 ug | INTRAMUSCULAR | Status: DC | PRN

## 2024-06-23 MED ORDER — DEXMEDETOMIDINE HCL IN NACL 80 MCG/20ML IV SOLN
INTRAVENOUS | Status: AC
  Filled 2024-06-23: qty 20

## 2024-06-23 MED ORDER — BUPIVACAINE HCL (PF) 0.25 % IJ SOLN
INTRAMUSCULAR | Status: DC | PRN
  Administered 2024-06-23: 10 mL

## 2024-06-23 MED ORDER — PHENYLEPHRINE HCL-NACL 20-0.9 MG/250ML-% IV SOLN
INTRAVENOUS | Status: DC | PRN
  Administered 2024-06-23: 60 ug/min via INTRAVENOUS

## 2024-06-23 MED ORDER — FENTANYL CITRATE (PF) 250 MCG/5ML IJ SOLN
INTRAMUSCULAR | Status: DC | PRN
  Administered 2024-06-23: 10 ug via INTRAVENOUS
  Administered 2024-06-23 (×3): 25 ug via INTRAVENOUS

## 2024-06-23 MED ORDER — ONDANSETRON HCL 4 MG/2ML IJ SOLN
INTRAMUSCULAR | Status: AC
  Filled 2024-06-23: qty 2

## 2024-06-23 MED ORDER — BUPIVACAINE HCL (PF) 0.25 % IJ SOLN
INTRAMUSCULAR | Status: AC
  Filled 2024-06-23: qty 10

## 2024-06-23 MED ORDER — CEFAZOLIN SODIUM-DEXTROSE 2-4 GM/100ML-% IV SOLN
INTRAVENOUS | Status: AC
  Filled 2024-06-23: qty 100

## 2024-06-23 MED ORDER — CEFAZOLIN SODIUM-DEXTROSE 2-3 GM-%(50ML) IV SOLR
INTRAVENOUS | Status: DC | PRN
  Administered 2024-06-23: 2 g via INTRAVENOUS

## 2024-06-23 MED ORDER — OXYCODONE HCL 5 MG PO TABS: 5.0000 mg | ORAL_TABLET | Freq: Once | ORAL | Status: DC | PRN

## 2024-06-23 MED ORDER — MIDAZOLAM HCL 2 MG/2ML IJ SOLN
INTRAMUSCULAR | Status: AC
  Filled 2024-06-23: qty 2

## 2024-06-23 MED ORDER — ACETAMINOPHEN 10 MG/ML IV SOLN
1000.0000 mg | Freq: Once | INTRAVENOUS | Status: DC | PRN
  Administered 2024-06-23: 1000 mg via INTRAVENOUS

## 2024-06-23 MED ORDER — FENTANYL CITRATE (PF) 100 MCG/2ML IJ SOLN
INTRAMUSCULAR | Status: DC | PRN
  Administered 2024-06-23: 15 ug via INTRATHECAL

## 2024-06-23 MED ORDER — ACETAMINOPHEN 10 MG/ML IV SOLN
INTRAVENOUS | Status: AC
  Filled 2024-06-23: qty 100

## 2024-06-23 MED ORDER — OXYCODONE HCL 5 MG/5ML PO SOLN: 5.0000 mg | Freq: Once | ORAL | Status: DC | PRN

## 2024-06-23 MED ORDER — ONDANSETRON HCL 4 MG/2ML IJ SOLN
INTRAMUSCULAR | Status: DC | PRN
Start: 2024-06-23 — End: 2024-06-23
  Administered 2024-06-23: 4 mg via INTRAVENOUS

## 2024-06-23 MED ORDER — BUPIVACAINE IN DEXTROSE 0.75-8.25 % IT SOLN
INTRATHECAL | Status: DC | PRN
  Administered 2024-06-23: 1.2 mL via INTRATHECAL

## 2024-06-23 MED ORDER — IBUPROFEN 600 MG PO TABS: 600.0000 mg | ORAL_TABLET | Freq: Four times a day (QID) | ORAL | 0 refills | Status: AC

## 2024-06-23 MED ORDER — PHENYLEPHRINE HCL (PRESSORS) 10 MG/ML IV SOLN
INTRAVENOUS | Status: DC | PRN
  Administered 2024-06-23: 80 ug via INTRAVENOUS

## 2024-06-23 MED ORDER — MIDAZOLAM HCL 5 MG/5ML IJ SOLN
INTRAMUSCULAR | Status: DC | PRN
Start: 2024-06-23 — End: 2024-06-23
  Administered 2024-06-23: 2 mg via INTRAVENOUS

## 2024-06-23 MED ORDER — DEXMEDETOMIDINE HCL IN NACL 80 MCG/20ML IV SOLN
INTRAVENOUS | Status: DC | PRN
  Administered 2024-06-23 (×2): 8 ug via INTRAVENOUS
  Administered 2024-06-23: 4 ug via INTRAVENOUS
  Administered 2024-06-23: 8 ug via INTRAVENOUS

## 2024-06-23 MED ORDER — STERILE WATER FOR IRRIGATION IR SOLN
Status: DC | PRN
  Administered 2024-06-23: 1

## 2024-06-23 MED ORDER — FENTANYL CITRATE (PF) 100 MCG/2ML IJ SOLN
INTRAMUSCULAR | Status: AC
  Filled 2024-06-23: qty 2

## 2024-06-23 MED ORDER — DEXAMETHASONE SODIUM PHOSPHATE 4 MG/ML IJ SOLN
INTRAMUSCULAR | Status: DC | PRN
  Administered 2024-06-23 (×2): 5 mg via INTRAVENOUS

## 2024-06-23 SURGICAL SUPPLY — 27 items
CLOTH BEACON ORANGE TIMEOUT ST (SAFETY) ×1 IMPLANT
DERMABOND ADVANCED .7 DNX12 (GAUZE/BANDAGES/DRESSINGS) ×1 IMPLANT
DISSECTOR SURG LIGASURE 21 (MISCELLANEOUS) IMPLANT
DRSG OPSITE POSTOP 3X4 (GAUZE/BANDAGES/DRESSINGS) ×1 IMPLANT
DURAPREP 26ML APPLICATOR (WOUND CARE) ×1 IMPLANT
ELECTRODE REM PT RTRN 9FT ADLT (ELECTROSURGICAL) ×1 IMPLANT
GLOVE BIO SURGEON STRL SZ7.5 (GLOVE) ×1 IMPLANT
GLOVE BIOGEL PI IND STRL 7.0 (GLOVE) ×1 IMPLANT
GLOVE BIOGEL PI IND STRL 7.5 (GLOVE) ×1 IMPLANT
GOWN STRL REUS W/TWL LRG LVL3 (GOWN DISPOSABLE) ×2 IMPLANT
LIGASURE IMPACT 36 18CM CVD LR (INSTRUMENTS) IMPLANT
MAT PREVALON FULL STRYKER (MISCELLANEOUS) IMPLANT
NEEDLE HYPO 22GX1.5 SAFETY (NEEDLE) ×1 IMPLANT
NS IRRIG 1000ML POUR BTL (IV SOLUTION) ×1 IMPLANT
PACK ABDOMINAL MINOR (CUSTOM PROCEDURE TRAY) ×1 IMPLANT
PENCIL BUTTON HOLSTER BLD 10FT (ELECTRODE) ×1 IMPLANT
PROTECTOR NERVE ULNAR (MISCELLANEOUS) ×1 IMPLANT
SET BERKELEY SUCTION TUBING (SUCTIONS) IMPLANT
SPONGE LAP 4X18 RFD (DISPOSABLE) IMPLANT
SUT MNCRL AB 3-0 PS2 27 (SUTURE) ×1 IMPLANT
SUT PLAIN 0 NONE (SUTURE) IMPLANT
SUT VIC AB 0 CT1 36 (SUTURE) ×1 IMPLANT
SUT VIC AB 2-0 SH 27XBRD (SUTURE) ×2 IMPLANT
SYR CONTROL 10ML LL (SYRINGE) ×1 IMPLANT
TOWEL OR 17X24 6PK STRL BLUE (TOWEL DISPOSABLE) ×2 IMPLANT
TRAY FOLEY CATH SILVER 14FR (SET/KITS/TRAYS/PACK) ×1 IMPLANT
YANKAUER SUCT BULB TIP NO VENT (SUCTIONS) IMPLANT

## 2024-06-23 NOTE — Anesthesia Postprocedure Evaluation (Signed)
 Anesthesia Post Note  Patient: Annasophia Briana Nold  Procedure(s) Performed: LIGATION, FALLOPIAN TUBE, POSTPARTUM (Bilateral)     Patient location during evaluation: PACU Anesthesia Type: Spinal Level of consciousness: awake Pain management: pain level controlled Vital Signs Assessment: post-procedure vital signs reviewed and stable Respiratory status: spontaneous breathing, nonlabored ventilation and respiratory function stable Cardiovascular status: blood pressure returned to baseline and stable Postop Assessment: no apparent nausea or vomiting Anesthetic complications: no   No notable events documented.  Last Vitals:  Vitals:   06/23/24 1200 06/23/24 1206  BP: 102/60 (!) 97/59  Pulse: 62 66  Resp: 13 16  Temp: (!) 36.4 C 36.6 C  SpO2: 100% 100%    Last Pain:  Vitals:   06/23/24 1206  TempSrc: Oral  PainSc: 0-No pain   Pain Goal:                   Bernardino SQUIBB Leelyn Jasinski

## 2024-06-23 NOTE — Progress Notes (Addendum)
 Pt seen in preop.  I reviewed the procedure, risks, benefits and alternatives including but not limited to bleeding infection injury, permanence, risk of regret, risk of failure and questions answered.  Pt would like to proceed.  Consent signed and witnessed.  Hgb 10.8 and Platelets 251.

## 2024-06-23 NOTE — Transfer of Care (Signed)
 Immediate Anesthesia Transfer of Care Note  Patient: Amber Gibson  Procedure(s) Performed: LIGATION, FALLOPIAN TUBE, POSTPARTUM (Bilateral)  Patient Location: PACU  Anesthesia Type:Spinal  Level of Consciousness: awake, alert , and oriented  Airway & Oxygen Therapy: Patient Spontanous Breathing  Post-op Assessment: Report given to RN and Post -op Vital signs reviewed and stable  Post vital signs: Reviewed and stable  Last Vitals:  Vitals Value Taken Time  BP 100/61 06/23/24 11:08  Temp    Pulse 75 06/23/24 11:09  Resp 13 06/23/24 11:09  SpO2 97 % 06/23/24 11:09  Vitals shown include unfiled device data.  Last Pain:  Vitals:   06/23/24 0912  TempSrc: Oral  PainSc:          Complications: No notable events documented.

## 2024-06-23 NOTE — Op Note (Incomplete)
.  gynThe patient was taken to the operating room after the risks, benefits, alternative, complications, treatment options, and expected outcomes were discussed with the patient. The patient verbalized understanding, the patient concurred with the proposed plan and consent signed and witnessed. The patient was taken to the Operating Room, identified as ***  and the procedure verified as bilateral tubal ligation. A Time Out was held and the above information confirmed.  The patient was given a surgical level via the epidural and prepped, draped, and catheterized in the normal, sterile fashion in the supine position.  A 10mm incision was made at the umbilicus and carried down to the underlying layer of fascia and peritoneum entered without difficulty.  The left fallopian tube was indentified and carried out to its fimbriated end and Kelly clamps used to clamp tube after elevating with babcock.  In a sequential fashion the tube was cut and suture ligated with 2-0 vicryl doubly. The tube was sent to pathology.  The same was done on the contralateral side.    The fascia was then repaired with 0 vicryl via a running stitch and the incision was closed with 3-0 vicryl via subcuticular sutures and dermabond applied.  Instrument, sponge, and needle counts were correct, patient tolerated the procedure well and was returned to the recovery room in good condition.

## 2024-06-23 NOTE — Progress Notes (Signed)
 Pt escorted to PACU for BTL via bed

## 2024-06-23 NOTE — Anesthesia Procedure Notes (Signed)
 Spinal  Patient location during procedure: OR Start time: 06/23/2024 9:40 AM End time: 06/23/2024 9:45 AM Reason for block: surgical anesthesia Staffing Performed: anesthesiologist  Anesthesiologist: Patrisha Bernardino SQUIBB, MD Performed by: Patrisha Bernardino SQUIBB, MD Authorized by: Patrisha Bernardino SQUIBB, MD   Preanesthetic Checklist Completed: patient identified, IV checked, risks and benefits discussed, surgical consent, monitors and equipment checked, pre-op evaluation and timeout performed Spinal Block Patient position: sitting Prep: DuraPrep Patient monitoring: cardiac monitor, continuous pulse ox and blood pressure Approach: midline Location: L3-4 Injection technique: single-shot Needle Needle type: Pencan  Needle gauge: 24 G Needle length: 9 cm Assessment Sensory level: T10 Events: CSF return Additional Notes Functioning IV was confirmed and monitors were applied. Sterile prep and drape, including hand hygiene and sterile gloves were used. The patient was positioned and the spine was prepped. The skin was anesthetized with lidocaine .  Free flow of clear CSF was obtained prior to injecting local anesthetic into the CSF.  The spinal needle aspirated freely following injection.  The needle was carefully withdrawn.  The patient tolerated the procedure well.

## 2024-06-23 NOTE — Anesthesia Preprocedure Evaluation (Addendum)
 Anesthesia Evaluation  Patient identified by MRN, date of birth, ID band Patient awake    Reviewed: Allergy & Precautions, NPO status , Patient's Chart, lab work & pertinent test results  Airway Mallampati: II       Dental no notable dental hx.    Pulmonary neg pulmonary ROS   Pulmonary exam normal        Cardiovascular negative cardio ROS Normal cardiovascular exam     Neuro/Psych negative neurological ROS  negative psych ROS   GI/Hepatic negative GI ROS, Neg liver ROS,,,  Endo/Other  negative endocrine ROS    Renal/GU negative Renal ROS     Musculoskeletal negative musculoskeletal ROS (+)    Abdominal   Peds  Hematology  (+) Blood dyscrasia, anemia PLT: 251 Alpha thalassemia silent carrier   Anesthesia Other Findings patient desires sterilization  Reproductive/Obstetrics                              Anesthesia Physical Anesthesia Plan  ASA: 2  Anesthesia Plan: Spinal   Post-op Pain Management:    Induction:   PONV Risk Score and Plan: 2 and Treatment may vary due to age or medical condition  Airway Management Planned: Natural Airway  Additional Equipment:   Intra-op Plan:   Post-operative Plan:   Informed Consent: I have reviewed the patients History and Physical, chart, labs and discussed the procedure including the risks, benefits and alternatives for the proposed anesthesia with the patient or authorized representative who has indicated his/her understanding and acceptance.     Dental advisory given  Plan Discussed with: CRNA  Anesthesia Plan Comments:         Anesthesia Quick Evaluation

## 2024-06-23 NOTE — Discharge Summary (Signed)
 OB Discharge Summary  Patient Name: Amber Gibson DOB: [birth year] MRN: [id number]  Date of admission: 06/21/2024 Delivering provider: [place] , JADE  Admitting diagnosis: Indication for care in labor or delivery [O75.9] Intrauterine pregnancy: [redacted]w[redacted]d     Secondary diagnosis: Patient Active Problem List   Diagnosis Date Noted   S/P tubal ligation 06/23/2024   Postpartum care following vaginal delivery 10/26 06/23/2024   Indication for care in labor or delivery 06/21/2024   SVD (spontaneous vaginal delivery) 06/21/2024   Normal postpartum course 06/21/2024   Hidradenitis suppurativa 10/06/2022   Crohn's disease of small intestine without complication (HCC)    Alpha thalassemia silent carrier 09/07/2019    Date of discharge: 06/23/2024   Discharge diagnosis: Principal Problem:   Postpartum care following vaginal delivery 10/26 Active Problems:   Indication for care in labor or delivery   SVD (spontaneous vaginal delivery)   Normal postpartum course   S/P tubal ligation                                                           Augmentation: N/A Pain control: None Laceration:None Complications: None  Hospital course:  Onset of Labor With Vaginal Delivery      23 y.o. yo [id number] at [redacted]w[redacted]d was admitted in Active Labor on 06/21/2024.  Membrane Rupture Time/Date: 10:10 AM,06/21/2024  Delivery Method:Vaginal, Spontaneous Operative Delivery:N/A Episiotomy: None Lacerations:  None Patient had an uncomplicated postpartum course. She went for a tubal ligation on PP day #2 without complication. She is ambulating, tolerating a regular diet, passing flatus, and urinating well. Patient is discharged home in stable condition on 06/23/24.  Newborn Data: Birth date:06/[birth year]/2024 Birth time:10:18 AM Gender:Female Living status:Living Apgars:7 ,9  Weight:2920 g  Physical exam  Vitals:   06/23/24 1130 06/23/24 1145 06/23/24 1200 06/23/24 1206  BP: (!) 97/57 (!) 99/56 102/60 (!) 97/59   Pulse: 66 65 62 66  Resp: 12 (!) 9 13 16   Temp:   (!) 97.5 F (36.4 C) 97.8 F (36.6 C)  TempSrc:   Oral Oral  SpO2: 100% 99% 100% 100%   General: alert and cooperative Lochia: appropriate Uterine Fundus: firm Incision: Healing well with no significant drainage, Dressing is clean, dry, and intact Perineum: intact DVT Evaluation: No evidence of DVT seen on physical exam.  Labs: Lab Results  Component Value Date   WBC 11.2 (H) 06/22/2024   HGB 10.8 (L) 06/22/2024   HCT 33.2 (L) 06/22/2024   MCV 82.0 06/22/2024   PLT 251 06/22/2024      06/22/2024   12:05 PM 06/21/2024   12:15 PM 09/10/2019    7:00 PM 09/09/2019   11:25 PM  Edinburgh Postnatal Depression Scale Screening Tool  I have been able to laugh and see the funny side of things. 0 -- 0  --   I have looked forward with enjoyment to things. 0  0    I have blamed myself unnecessarily when things went wrong. 0  0    I have been anxious or worried for no good reason. 0  0    I have felt scared or panicky for no good reason. 0  0    Things have been getting on top of me. 0  1    I have been so unhappy that I have had difficulty  sleeping. 0  0    I have felt sad or miserable. 0  0    I have been so unhappy that I have been crying. 0  0    The thought of harming myself has occurred to me. 0  0    Edinburgh Postnatal Depression Scale Total 0  1       Data saved with a previous flowsheet row definition   Discharge instructions:  per After Visit Summary  After Visit Meds:  Allergies as of 06/23/2024       Reactions   Pineapple         Medication List     STOP taking these medications    budesonide  3 MG 24 hr capsule Commonly known as: ENTOCORT EC    cholestyramine  4 g packet Commonly known as: QUESTRAN    Clobetasol Propionate 0.05 % shampoo   Doxylamine -Pyridoxine  10-10 MG Tbec   mometasone 0.1 % cream Commonly known as: ELOCON   ondansetron  4 MG tablet Commonly known as: ZOFRAN    ondansetron  8 MG  tablet Commonly known as: ZOFRAN    promethazine  25 MG tablet Commonly known as: PHENERGAN    scopolamine 1 MG/3DAYS Commonly known as: TRANSDERM-SCOP   Sotyktu 6 MG Tabs Generic drug: Deucravacitinib   Stelara  90 MG/ML Sosy injection Generic drug: ustekinumab        TAKE these medications    acetaminophen  325 MG tablet Commonly known as: Tylenol  Take 2 tablets (650 mg total) by mouth every 4 (four) hours as needed (for pain scale < 4).   ibuprofen  600 MG tablet Commonly known as: ADVIL  Take 1 tablet (600 mg total) by mouth every 6 (six) hours.   prenatal multivitamin Tabs tablet Take 1 tablet by mouth daily at 12 noon.       Activity: Advance as tolerated. Pelvic rest for 6 weeks.   Newborn Data: Live born female  Birth Weight: 6 lb 7 oz (2920 g) APGAR: 7, 9  Newborn Delivery   Birth date/time: 06/[birth year]/2024 10:18:00 Delivery type: Vaginal, Spontaneous    Named Kyler Baby Feeding: Bottle Circumcision: Completed, Dr. Henry Disposition:home with mother  Delivery Report:  Review the Delivery Report for details.    Follow up:  Follow-up Information     [facility]. Schedule an appointment as soon as possible for a visit in 6 week(s).   Specialty: Obstetrics and Gynecology Contact information: [address] [place]  [address] [phone]               Alan MARLA Joshua EDDY, MSN 06/23/2024, 1:29 PM

## 2024-06-26 LAB — SURGICAL PATHOLOGY

## 2024-07-07 ENCOUNTER — Telehealth (HOSPITAL_COMMUNITY): Payer: Self-pay | Admitting: *Deleted

## 2024-07-07 NOTE — Telephone Encounter (Signed)
 Attempted hospital discharge follow-up call. Left message for patient to return RN call with any questions or concerns. Allean IVAR Carton, RN, 07/07/24, [phone]

## 2024-07-26 NOTE — Op Note (Signed)
 Preop Diagnosis: 1.PPD #2 2.Desires Sterilization   Postop Diagnosis: 1.PPD #2 2.Desires Sterilization    Procedure: No admission procedures for hospital encounter.   Anesthesia: Spinal   Anesthesiologist: See Flowsheet   Attending: Dr. Jon Rummer   Assistant: Surgical Tech  Findings: Normal appearing bilateral fallopian tubes  Pathology: Bilateral Fallopian Tubes  Fluids: See Flowsheet  UOP: See Flowsheet  EBL: See Flowsheet  Complications: None  Procedure: The patient was taken to the operating room after the risks, benefits, alternative, complications, treatment options, and expected outcomes were discussed with the patient. The patient verbalized understanding, the patient concurred with the proposed plan and consent signed and witnessed. The patient was taken to the Operating Room, identified as Amber Gibson  and the procedure verified as bilateral tubal ligation. A Time Out was held and the above information confirmed.  The patient was given a surgical level via the epidural and prepped, draped, and catheterized in the normal, sterile fashion in the supine position.  A 10mm incision was made at the umbilicus and carried down to the underlying layer of fascia and peritoneum entered without difficulty.  The left fallopian tube was indentified and carried out to its fimbriated end, elevated with babcocks and excised with a ligasure.  The tube was sent to pathology.  The same was done on the contralateral side.    The fascia was then repaired with 0 vicryl via a running stitch and the incision was closed with 3-0 vicryl via subcuticular sutures and dermabond applied.  Instrument, sponge, and needle counts were correct, patient tolerated the procedure well and was returned to the recovery room in good condition.
